# Patient Record
Sex: Male | Born: 1980
Health system: Southern US, Community
[De-identification: ages and names within clinical notes are randomized; demographics above are authoritative.]

## PROBLEM LIST (undated history)

## (undated) DIAGNOSIS — F419 Anxiety disorder, unspecified: Secondary | ICD-10-CM

## (undated) DIAGNOSIS — G8220 Paraplegia, unspecified: Secondary | ICD-10-CM

## (undated) DIAGNOSIS — F1910 Other psychoactive substance abuse, uncomplicated: Secondary | ICD-10-CM

## (undated) DIAGNOSIS — F329 Major depressive disorder, single episode, unspecified: Secondary | ICD-10-CM

## (undated) DIAGNOSIS — F32A Depression, unspecified: Secondary | ICD-10-CM

## (undated) DIAGNOSIS — W3400XA Accidental discharge from unspecified firearms or gun, initial encounter: Secondary | ICD-10-CM

## (undated) HISTORY — PX: OTHER SURGICAL HISTORY: SHX169

## (undated) HISTORY — PX: LEG SURGERY: SHX1003

---

## 2001-08-03 ENCOUNTER — Encounter: Payer: Self-pay | Admitting: Emergency Medicine

## 2001-08-03 ENCOUNTER — Emergency Department (HOSPITAL_COMMUNITY): Admission: EM | Admit: 2001-08-03 | Discharge: 2001-08-03 | Payer: Self-pay | Admitting: Emergency Medicine

## 2005-02-19 ENCOUNTER — Emergency Department (HOSPITAL_COMMUNITY): Admission: EM | Admit: 2005-02-19 | Discharge: 2005-02-19 | Payer: Self-pay | Admitting: Emergency Medicine

## 2010-10-07 ENCOUNTER — Emergency Department (HOSPITAL_COMMUNITY)
Admission: EM | Admit: 2010-10-07 | Discharge: 2010-10-07 | Payer: Self-pay | Source: Home / Self Care | Admitting: Emergency Medicine

## 2013-02-19 ENCOUNTER — Emergency Department (HOSPITAL_COMMUNITY)
Admission: EM | Admit: 2013-02-19 | Discharge: 2013-02-20 | Disposition: A | Discharge status: Other Acute Inpatient Hospital | Payer: Self-pay | Attending: Emergency Medicine | Admitting: Emergency Medicine

## 2013-02-19 ENCOUNTER — Encounter (HOSPITAL_COMMUNITY): Payer: Self-pay | Admitting: *Deleted

## 2013-02-19 DIAGNOSIS — F17200 Nicotine dependence, unspecified, uncomplicated: Secondary | ICD-10-CM | POA: Insufficient documentation

## 2013-02-19 DIAGNOSIS — F1920 Other psychoactive substance dependence, uncomplicated: Secondary | ICD-10-CM | POA: Diagnosis present

## 2013-02-19 DIAGNOSIS — F1010 Alcohol abuse, uncomplicated: Secondary | ICD-10-CM | POA: Insufficient documentation

## 2013-02-19 DIAGNOSIS — F1210 Cannabis abuse, uncomplicated: Secondary | ICD-10-CM | POA: Insufficient documentation

## 2013-02-19 DIAGNOSIS — F1410 Cocaine abuse, uncomplicated: Secondary | ICD-10-CM | POA: Insufficient documentation

## 2013-02-19 DIAGNOSIS — F1910 Other psychoactive substance abuse, uncomplicated: Secondary | ICD-10-CM | POA: Insufficient documentation

## 2013-02-19 HISTORY — DX: Depression, unspecified: F32.A

## 2013-02-19 HISTORY — DX: Major depressive disorder, single episode, unspecified: F32.9

## 2013-02-19 LAB — CBC WITH DIFFERENTIAL/PLATELET
Basophils Absolute: 0 10*3/uL (ref 0.0–0.1)
Basophils Relative: 0 % (ref 0–1)
Eosinophils Absolute: 0.1 10*3/uL (ref 0.0–0.7)
Eosinophils Relative: 1 % (ref 0–5)
HCT: 39.3 % (ref 39.0–52.0)
Hemoglobin: 13.9 g/dL (ref 13.0–17.0)
Lymphocytes Relative: 32 % (ref 12–46)
Lymphs Abs: 2.6 10*3/uL (ref 0.7–4.0)
MCH: 31.5 pg (ref 26.0–34.0)
MCHC: 35.4 g/dL (ref 30.0–36.0)
MCV: 89.1 fL (ref 78.0–100.0)
Monocytes Absolute: 0.7 10*3/uL (ref 0.1–1.0)
Monocytes Relative: 9 % (ref 3–12)
Neutro Abs: 4.7 10*3/uL (ref 1.7–7.7)
Neutrophils Relative %: 58 % (ref 43–77)
Platelets: 330 10*3/uL (ref 150–400)
RBC: 4.41 MIL/uL (ref 4.22–5.81)
RDW: 13.3 % (ref 11.5–15.5)
WBC: 8.1 10*3/uL (ref 4.0–10.5)

## 2013-02-19 LAB — BASIC METABOLIC PANEL
BUN: 13 mg/dL (ref 6–23)
CO2: 25 mEq/L (ref 19–32)
Calcium: 9.7 mg/dL (ref 8.4–10.5)
Chloride: 98 mEq/L (ref 96–112)
Creatinine, Ser: 1.01 mg/dL (ref 0.50–1.35)
GFR calc Af Amer: >90 mL/min (ref >90)
GFR calc non Af Amer: >90 mL/min (ref >90)
Glucose, Bld: 95 mg/dL (ref 70–99)
Potassium: 3.9 mEq/L (ref 3.5–5.1)
Sodium: 135 mEq/L (ref 135–145)

## 2013-02-19 LAB — RAPID URINE DRUG SCREEN, HOSP PERFORMED
Amphetamines: NOT DETECTED
Barbiturates: NOT DETECTED
Benzodiazepines: NOT DETECTED
Cocaine: POSITIVE — AB
Opiates: NOT DETECTED
Tetrahydrocannabinol: POSITIVE — AB

## 2013-02-19 LAB — ETHANOL: Alcohol, Ethyl (B): 71 mg/dL — ABNORMAL HIGH (ref 0–11)

## 2013-02-19 MED ORDER — LORAZEPAM 1 MG PO TABS: 0.0000 mg | ORAL_TABLET | Freq: Four times a day (QID) | ORAL | Status: DC

## 2013-02-19 MED ORDER — LORAZEPAM 1 MG PO TABS: 0.0000 mg | ORAL_TABLET | Freq: Two times a day (BID) | ORAL | Status: DC

## 2013-02-19 MED ORDER — VITAMIN B-1 100 MG PO TABS: 100.0000 mg | ORAL_TABLET | Freq: Every day | ORAL | Status: DC

## 2013-02-19 MED ORDER — ONDANSETRON HCL 4 MG PO TABS: 4.0000 mg | ORAL_TABLET | Freq: Three times a day (TID) | ORAL | Status: DC | PRN

## 2013-02-19 MED ORDER — IBUPROFEN 600 MG PO TABS: 600.0000 mg | ORAL_TABLET | Freq: Three times a day (TID) | ORAL | Status: DC | PRN

## 2013-02-19 MED ORDER — NICOTINE 21 MG/24HR TD PT24: 21.0000 mg | MEDICATED_PATCH | Freq: Every day | TRANSDERMAL | Status: DC

## 2013-02-19 MED ORDER — THIAMINE HCL 100 MG/ML IJ SOLN: 100.0000 mg | Freq: Every day | INTRAMUSCULAR | Status: DC

## 2013-02-19 MED ORDER — ALUM & MAG HYDROXIDE-SIMETH 200-200-20 MG/5ML PO SUSP: 30.0000 mL | ORAL | Status: DC | PRN

## 2013-02-19 MED ORDER — ZOLPIDEM TARTRATE 5 MG PO TABS: 5.0000 mg | ORAL_TABLET | Freq: Every evening | ORAL | Status: DC | PRN

## 2013-02-19 MED ORDER — ADULT MULTIVITAMIN W/MINERALS CH: 1.0000 | ORAL_TABLET | Freq: Every day | ORAL | Status: DC

## 2013-02-19 MED ORDER — FOLIC ACID 1 MG PO TABS: 1.0000 mg | ORAL_TABLET | Freq: Every day | ORAL | Status: DC

## 2013-02-19 NOTE — ED Notes (Signed)
Pt requesting detox from marijuana/cocaine/alcohol; last use today; calm and cooperative; denies si/hi

## 2013-02-20 ENCOUNTER — Inpatient Hospital Stay (HOSPITAL_COMMUNITY)
Admission: EM | Admit: 2013-02-20 | Discharge: 2013-02-23 | DRG: 897 | Disposition: A | Discharge status: Home / Self Care | Payer: No Typology Code available for payment source | Source: Intra-hospital | Attending: Psychiatry | Admitting: Psychiatry

## 2013-02-20 ENCOUNTER — Encounter (HOSPITAL_COMMUNITY): Payer: Self-pay | Admitting: *Deleted

## 2013-02-20 ENCOUNTER — Other Ambulatory Visit: Payer: Self-pay | Admitting: Medical

## 2013-02-20 DIAGNOSIS — F1020 Alcohol dependence, uncomplicated: Principal | ICD-10-CM | POA: Diagnosis present

## 2013-02-20 DIAGNOSIS — F39 Unspecified mood [affective] disorder: Secondary | ICD-10-CM

## 2013-02-20 DIAGNOSIS — F1920 Other psychoactive substance dependence, uncomplicated: Secondary | ICD-10-CM | POA: Diagnosis present

## 2013-02-20 DIAGNOSIS — F1420 Cocaine dependence, uncomplicated: Secondary | ICD-10-CM | POA: Diagnosis present

## 2013-02-20 DIAGNOSIS — Z79899 Other long term (current) drug therapy: Secondary | ICD-10-CM

## 2013-02-20 DIAGNOSIS — F1994 Other psychoactive substance use, unspecified with psychoactive substance-induced mood disorder: Secondary | ICD-10-CM | POA: Diagnosis present

## 2013-02-20 LAB — POCT I-STAT TROPONIN I: Troponin i, poc: 0 ng/mL (ref 0.00–0.08)

## 2013-02-20 LAB — HEPATIC FUNCTION PANEL
Bilirubin, Direct: <0.1 mg/dL (ref 0.0–0.3)
Total Protein: 7.3 g/dL (ref 6.0–8.3)

## 2013-02-20 MED ORDER — CHLORDIAZEPOXIDE HCL 25 MG PO CAPS
25.0000 mg | ORAL_CAPSULE | Freq: Four times a day (QID) | ORAL | Status: AC
  Administered 2013-02-20 (×4): 25 mg via ORAL
  Filled 2013-02-20 (×4): qty 1

## 2013-02-20 MED ORDER — VITAMIN B-1 100 MG PO TABS
100.0000 mg | ORAL_TABLET | Freq: Every day | ORAL | Status: DC
  Administered 2013-02-21 – 2013-02-23 (×3): 100 mg via ORAL
  Filled 2013-02-20 (×5): qty 1

## 2013-02-20 MED ORDER — NICOTINE POLACRILEX 2 MG MT GUM: 2.0000 mg | CHEWING_GUM | OROMUCOSAL | Status: DC | PRN

## 2013-02-20 MED ORDER — ACETAMINOPHEN 325 MG PO TABS: 650.0000 mg | ORAL_TABLET | Freq: Four times a day (QID) | ORAL | Status: DC | PRN

## 2013-02-20 MED ORDER — CHLORDIAZEPOXIDE HCL 25 MG PO CAPS
25.0000 mg | ORAL_CAPSULE | Freq: Four times a day (QID) | ORAL | Status: AC | PRN
  Administered 2013-02-20: 25 mg via ORAL
  Filled 2013-02-20: qty 1

## 2013-02-20 MED ORDER — MAGNESIUM HYDROXIDE 400 MG/5ML PO SUSP: 30.0000 mL | Freq: Every day | ORAL | Status: DC | PRN

## 2013-02-20 MED ORDER — CHLORDIAZEPOXIDE HCL 25 MG PO CAPS
25.0000 mg | ORAL_CAPSULE | ORAL | Status: AC
  Administered 2013-02-22 (×2): 25 mg via ORAL
  Filled 2013-02-20 (×2): qty 1

## 2013-02-20 MED ORDER — LOPERAMIDE HCL 2 MG PO CAPS: 2.0000 mg | ORAL_CAPSULE | ORAL | Status: AC | PRN

## 2013-02-20 MED ORDER — NICOTINE 21 MG/24HR TD PT24
21.0000 mg | MEDICATED_PATCH | Freq: Every day | TRANSDERMAL | Status: DC
  Administered 2013-02-20 – 2013-02-23 (×4): 21 mg via TRANSDERMAL
  Filled 2013-02-20 (×8): qty 1

## 2013-02-20 MED ORDER — LORAZEPAM 1 MG PO TABS
2.0000 mg | ORAL_TABLET | ORAL | Status: DC | PRN
  Administered 2013-02-20: 2 mg via ORAL
  Filled 2013-02-20: qty 2

## 2013-02-20 MED ORDER — THIAMINE HCL 100 MG/ML IJ SOLN
100.0000 mg | Freq: Once | INTRAMUSCULAR | Status: AC
  Administered 2013-02-20: 100 mg via INTRAMUSCULAR

## 2013-02-20 MED ORDER — TRAZODONE HCL 50 MG PO TABS
50.0000 mg | ORAL_TABLET | Freq: Every evening | ORAL | Status: DC | PRN
  Administered 2013-02-20 – 2013-02-22 (×3): 50 mg via ORAL
  Filled 2013-02-20 (×3): qty 1

## 2013-02-20 MED ORDER — ADULT MULTIVITAMIN W/MINERALS CH
1.0000 | ORAL_TABLET | Freq: Every day | ORAL | Status: DC
  Administered 2013-02-20 – 2013-02-23 (×4): 1 via ORAL
  Filled 2013-02-20 (×6): qty 1

## 2013-02-20 MED ORDER — CHLORDIAZEPOXIDE HCL 25 MG PO CAPS
25.0000 mg | ORAL_CAPSULE | Freq: Every day | ORAL | Status: AC
  Administered 2013-02-23: 25 mg via ORAL
  Filled 2013-02-20: qty 1

## 2013-02-20 MED ORDER — CHLORDIAZEPOXIDE HCL 25 MG PO CAPS
25.0000 mg | ORAL_CAPSULE | Freq: Three times a day (TID) | ORAL | Status: AC
  Administered 2013-02-21 (×3): 25 mg via ORAL
  Filled 2013-02-20 (×3): qty 1

## 2013-02-20 MED ORDER — ONDANSETRON 4 MG PO TBDP: 4.0000 mg | ORAL_TABLET | Freq: Four times a day (QID) | ORAL | Status: AC | PRN

## 2013-02-20 NOTE — BHH Counselor (Signed)
Pt has been accepted by Maryjean Morn, PA to the service of [facility], 300[facility]2.

## 2013-02-20 NOTE — Progress Notes (Signed)
D: Patient denies SI/HI and A/V hallucinations; patient reports sleep is well; reports appetite is good; reports energy level is normal ; reports ability to pay attention is good; rates depression as 1/10; rates hopelessness -0/10; rates anxiety as 6/10;   A: Monitored q 15 minutes; patient encouraged to attend groups; patient educated about medications; patient given medications per physician orders; patient encouraged to express feelings and/or concerns  R: Patient is flat and cooperative and pleasant; patient reports a relief of his withdrawal and reports that his anxiety is decreasing patient's interaction with staff and peers is appropriate;  patient is taking medications as prescribed and tolerating medications; patient is attending all groups and he is opening up in group and appears to be invested in treatment by engaging in conversation in the groups and sharing his story

## 2013-02-20 NOTE — ED Provider Notes (Signed)
Medical screening examination/treatment/procedure(s) were performed by non-physician practitioner and as supervising physician I was immediately available for consultation/collaboration.  Accepted to [facility] by Dr Dub Mikes  Sunnie Nielsen, MD 02/20/13 [phone]

## 2013-02-20 NOTE — BH Assessment (Signed)
Assessment Note   Brandon Glass is a 32 y.o. male who presents [facility] for detox from alcohol, thc and cocaine.  Pt denies SI/HI/Psych.  Pt reports the following: pt drinks 5-7 40's and 1/5 daily, last drink was 02/19/13, pt consumed 2 cans and 1-40oz beer; pt uses 1 gram of cocaine daily, last use was 02/19/13 and 1/2 up to quarter oz of thc daily, last use was 02/19/13.  Pt says--"I see myself becoming someone I'm not.".  Pt lives with girlfriend and son and states girlfriend encouraged him to seek help.  Pt has past inpt admissions with [facility] and [facility], both in 2012.  Pt denies seizure or blackout activity due to SA but says when he has withdrawals, he experiences cold sweats and tremors.  Pt reports legal issues--DUI(continuance), no court date at this time.  This Clinical research associate explained no detox protocol avail for thc and cocaine, encouraged pt to seek rehab after detox treatment to maintain sobriety.  No avail beds with [facility] and pt has no transportation for [facility].    Axis I: Alcohol Dependence, Cocaine Abuse, Cannabis Dependence Axis II: Deferred Axis III:  Past Medical History  Diagnosis Date  . Depression    Axis IV: other psychosocial or environmental problems, problems related to legal system/crime, problems related to social environment and problems with primary support group Axis V: 51-60 moderate symptoms  Past Medical History:  Past Medical History  Diagnosis Date  . Depression     History reviewed. No pertinent past surgical history.  Family History: No family history on file.  Social History:  reports that he has been smoking Cigarettes.  He has been smoking about 1.00 pack per day. He does not have any smokeless tobacco history on file. He reports that he drinks about 3.0 ounces of alcohol per week. He reports that he uses illicit drugs (Cocaine and Marijuana).  Additional Social History:  Alcohol / Drug Use Pain Medications: None  Prescriptions: None  Over the Counter: None   History of alcohol / drug use?: Yes Longest period of sobriety (when/how long): Only when in detox  Negative Consequences of Use: Personal relationships;Legal Withdrawal Symptoms: Other (Comment) (No current w/d sxs ) Substance #1 Name of Substance 1: Alcohol 1 - Age of First Use: Teens  1 - Amount (size/oz): 5-7 40's; 1/5  1 - Frequency: Daily  1 - Duration: On-going  1 - Last Use / Amount: 02/19/13 Substance #2 Name of Substance 2: THC  2 - Age of First Use: Teens  2 - Amount (size/oz): 1/2- quarter oz  2 - Frequency: Daily  2 - Duration: On-going  2 - Last Use / Amount: 02/19/13 Substance #3 Name of Substance 3: Cocaine  3 - Age of First Use: Teens  3 - Amount (size/oz): 1 Gram  3 - Frequency: Wkly  3 - Duration: On-going  3 - Last Use / Amount: 02/19/13  CIWA: CIWA-Ar BP: 138/64 mmHg Pulse Rate: 115 Nausea and Vomiting: no nausea and no vomiting Tactile Disturbances: none Tremor: no tremor Auditory Disturbances: not present Paroxysmal Sweats: no sweat visible Visual Disturbances: not present Anxiety: no anxiety, at ease Headache, Fullness in Head: none present Agitation: normal activity Orientation and Clouding of Sensorium: oriented and can do serial additions CIWA-Ar Total: 0 COWS:    Allergies: No Known Allergies  Home Medications:  (Not in a hospital admission)  OB/GYN Status:  No LMP for male patient.  General Assessment Data [facility]: [facility] Living Arrangements:  Spouse/significant other;Children (Lives w/girlfriend and son ) Can pt return to current living arrangement?: Yes Admission Status: Voluntary Is patient capable of signing voluntary admission?: Yes Transfer from: Acute Hospital Referral Source: MD  Education Status Is patient currently in school?: No Current Grade: None  Highest grade of school patient has completed: None  Name of school: None  Contact person: None   Risk to self Suicidal Ideation: No Suicidal Intent:  No Is patient at risk for suicide?: No Suicidal Plan?: No Access to Means: No What has been your use of drugs/alcohol within the last 12 months?: Abusing: alcohol, cocaine, thc Previous Attempts/Gestures: No How many times?: 0 Other Self Harm Risks: None  Triggers for Past Attempts: None known Intentional Self Injurious Behavior: None Family Suicide History: No Recent stressful life event(s): Other (Comment) (Chronic SA, relational due to SA) Persecutory voices/beliefs?: No Depression: Yes Depression Symptoms: Guilt;Loss of interest in usual pleasures;Feeling worthless/self pity Substance abuse history and/or treatment for substance abuse?: Yes Suicide prevention information given to non-admitted patients: Not applicable  Risk to Others Homicidal Ideation: No Thoughts of Harm to Others: No Current Homicidal Intent: No Current Homicidal Plan: No Access to Homicidal Means: No Identified Victim: None  History of harm to others?: No Assessment of Violence: None Noted Violent Behavior Description: None  Does patient have access to weapons?: No Criminal Charges Pending?: Yes Describe Pending Criminal Charges: DUI--Continuance x32yrs  Does patient have a court date: No  Psychosis Hallucinations: None noted Delusions: None noted  Mental Status Report Appear/Hygiene: Other (Comment) (Appropriate ) Eye Contact: Good Motor Activity: Unremarkable Speech: Logical/coherent Level of Consciousness: Alert Mood: Depressed Affect: Depressed Anxiety Level: None Thought Processes: Coherent;Relevant Judgement: Unimpaired Orientation: Person;Place;Time;Situation Obsessive Compulsive Thoughts/Behaviors: None  Cognitive Functioning Concentration: Normal Memory: Recent Intact;Remote Intact IQ: Average Insight: Fair Impulse Control: Fair Appetite: Good Weight Loss: 0 Weight Gain: 0 Sleep: No Change Total Hours of Sleep: 6 Vegetative Symptoms: None  ADLScreening [employer] Assessment  Services) Patient's cognitive ability adequate to safely complete daily activities?: Yes Patient able to express need for assistance with ADLs?: Yes Independently performs ADLs?: Yes (appropriate for developmental age)  Abuse/Neglect [facility]) Physical Abuse: Denies Verbal Abuse: Denies Sexual Abuse: Denies  Prior Inpatient Therapy Prior Inpatient Therapy: Yes Prior Therapy Dates: 2012 Prior Therapy Facilty/Provider(s): [facility]; [facility]  Reason for Treatment: Detox/Rehab   Prior Outpatient Therapy Prior Outpatient Therapy: No Prior Therapy Dates: None  Prior Therapy Facilty/Provider(s): None  Reason for Treatment: None   ADL Screening (condition at time of admission) Patient's cognitive ability adequate to safely complete daily activities?: Yes Patient able to express need for assistance with ADLs?: Yes Independently performs ADLs?: Yes (appropriate for developmental age) Weakness of Legs: None Weakness of Arms/Hands: None  Home Assistive Devices/Equipment Home Assistive Devices/Equipment: None  Therapy Consults (therapy consults require a physician order) PT Evaluation Needed: No OT Evalulation Needed: No SLP Evaluation Needed: No Abuse/Neglect Assessment (Assessment to be complete while patient is alone) Physical Abuse: Denies Verbal Abuse: Denies Sexual Abuse: Denies Exploitation of patient/patient's resources: Denies Self-Neglect: Denies Values / Beliefs Cultural Requests During Hospitalization: None Spiritual Requests During Hospitalization: None Consults Spiritual Care Consult Needed: No Social Work Consult Needed: No Merchant navy officer (For Healthcare) Advance Directive: Patient does not have advance directive;Patient would not like information Pre-existing out of facility DNR order (yellow form or pink MOST form): No Nutrition Screen- [facility] Patient's home diet: Regular Have you recently lost weight without trying?: No Have you been eating poorly because  of a  decreased appetite?: No Malnutrition Screening Tool Score: 0  Additional Information 1:1 In Past 12 Months?: No CIRT Risk: No Elopement Risk: No Does patient have medical clearance?: Yes     Disposition:  Disposition Initial Assessment Completed for this Encounter: Yes Disposition of Patient: Inpatient treatment program;Referred to [facility] ) Type of inpatient treatment program: Adult Patient referred to: Other (Comment) [facility] )  On Site Evaluation by:   Reviewed with Physician:     Murrell Redden 02/20/2013 1:47 AM

## 2013-02-20 NOTE — Progress Notes (Signed)
Adult Psychoeducational Group Note  Date:  02/20/2013 Time:  8:52 PM  Group Topic/Focus:  AA group  Participation Level:  Active  Participation Quality:  Appropriate  Affect:  Appropriate  Cognitive:  Alert  Insight: Appropriate  Engagement in Group:  Engaged  Modes of Intervention:  Discussion  Additional Comments:    Flonnie Hailstone 02/20/2013, 8:52 PM

## 2013-02-20 NOTE — BHH Group Notes (Signed)
[facility] LCSW Aftercare Discharge Planning Group Note   02/20/2013 8:45 AM  Participation Quality:  Appropriate  Mood/Affect:  Appropriate  Depression Rating:  3  Anxiety Rating:  6-7  Thoughts of Suicide:  No Will you contract for safety?   NA  Current AVH:  No  Plan for Discharge/Comments: Explore inpatient treatment options   Transportation Means: Family  Supports: Family, significant other, 31 YO son and church community  [place], Julious Payer
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
## 2013-02-20 NOTE — Progress Notes (Signed)
Pt vol admitted via [facility] for detox from alcohol. Pt drinking 5-7 40oz beers/daily and/or a fifth of liquor. Also using THC daily and occasional cocaine (1 gm per use). Pt wishes to get clean for girlfriend and child. Pending DUI in the courts. Last attempt at detox was x 2 in 2012. CIWA 2 at this time with elevated pulse and BP (see flowsheet). Medicated with librium prn. Oriented to unit and provided fluids (refused meal). Level III obs in place. On reassess pt is asleep. No medical hx, home meds or allergies. No SI/HI/AVH, no previous psych hx. Lawrence Marseilles

## 2013-02-20 NOTE — ED Notes (Signed)
Pt reports abusing THC and ETOH daily; drinking 5-7 forty ounces a day and sometimes a fifth of liquor. He says he doesn't use cocaine daily but when he does he uses about a gram. Pt has detoxed twice before the last time in 2012 and was clean for about 6 months after that then stress caused him to start using again. He's never been under the care of a psychiatrist and never attempted suicide. He denies SI/HI/AVH. He reports he wants to detox now to have a better life with his wife, son and family and everything.

## 2013-02-20 NOTE — BHH Group Notes (Signed)
B[facility] LCSW Group Therapy  02/20/2013 1:15 PM  Type of Therapy:  Group Therapy 1:15 to 2:30  Participation Level:  Minimal  Participation Quality:  Appropriate and Attentive  Affect:  Appropriate  Cognitive:  Alert, Appropriate and Oriented  Insight:  Developing/Improving  Engagement in Therapy:  Developing/Improving  Modes of Intervention:  Education, Exploration, Rapport Building, Socialization and Support  Summary of Progress/Problems: Group session included an educational portion on Post Acute Withdrawal Syndrome (PAWS).  Patients were able to process the information and share feelings related to length of time recovery takes. [name] was attentive to facilitator and those sharing their own process but did not share himself.    Brandon Glass

## 2013-02-20 NOTE — BHH Suicide Risk Assessment (Signed)
Suicide Risk Assessment  Admission Assessment     Nursing information obtained from:  Patient Demographic factors:  Male Current Mental Status:   (none) Loss Factors:  Legal issues Historical Factors:  Family history of mental illness or substance abuse Risk Reduction Factors:  Responsible for children under 31 years of age;Sense of responsibility to family;Living with another person, especially a relative;Positive social support;Positive therapeutic relationship  CLINICAL FACTORS:   Depression:   Comorbid alcohol abuse/dependence Alcohol/Substance Abuse/Dependencies  COGNITIVE FEATURES THAT CONTRIBUTE TO RISK: None identified   SUICIDE RISK:   Moderate:  Frequent suicidal ideation with limited intensity, and duration, some specificity in terms of plans, no associated intent, good self-control, limited dysphoria/symptomatology, some risk factors present, and identifiable protective factors, including available and accessible social support.  PLAN OF CARE: Supportive approach/coping skills/ relapse prevention                              Librium Detox protocol  I certify that inpatient services furnished can reasonably be expected to improve the patient's condition.  Ande Therrell A 02/20/2013, 9:09 AM

## 2013-02-20 NOTE — H&P (Signed)
Psychiatric Admission Assessment Adult  Patient Identification:  Brandon Glass Date of Evaluation:  02/20/2013 Chief Complaint:  detox History of Present Illness:: Drinking a lot, 5 to 7 40's and a fith of liquor every odther day. Rewal heavy since 2007-2008. Stress, life, lost friennds, one fried was murdere, another drawned. start drinking more and more. Last year go really bad last year (life stress, finances)  Endorses cocaine one gram every day, more during the weekend. The use escalated. Marijuana every day. Started drinkjng at 53. Cocaine started in [employer]. Elements:  Location:  in patient. Quality:  unable to function. Severity:  severe. Timing:  every day. Duration:  building up for several years. Context:  alcohol dependence,cocaine dependence, unresolved grief issue, poor copins. Associated Signs/Synptoms: Depression Symptoms:  depressed mood, anhedonia, anxiety, insomnia, disturbed sleep, (Hypo) Manic Symptoms:  denies Anxiety Symptoms:  Excessive Worry, Psychotic Symptoms:  Denies PTSD Symptoms: Denies   Psychiatric Specialty Exam: Physical Exam  ROS  Blood pressure 145/88, pulse 101, temperature 98.7 F (37.1 C), temperature source Oral, resp. rate 18, height 6\' 1"  (1.854 m), weight 102.967 kg (227 lb).Body mass index is 29.96 kg/(m^2).  General Appearance: Fairly Groomed  Patent attorney::  Minimal  Speech:  Clear and Coherent, Slow and not spontaneous  Volume:  Decreased  Mood:  Depressed  Affect:  Restricted  Thought Process:  Coherent and Goal Directed  Orientation:  Full (Time, Place, and Person)  Thought Content:  worries, concerns  Suicidal Thoughts:  No  Homicidal Thoughts:  No  Memory:  Immediate;   Fair Recent;   Fair Remote;   Fair  Judgement:  Fair  Insight:  Present  Psychomotor Activity:  Restlessness  Concentration:  Fair  Recall:  Fair  Akathisia:  No  Handed:  Right  AIMS (if indicated):     Assets:  Desire for Improvement Social  Support  Sleep:  Number of Hours: 0.75 (new admit)    Past Psychiatric History: Diagnosis: Acohol Dependence, Cocaine Dependence, Substance Induced Mood Disorder  Hospitalizations: Detox once before  Outpatient Care: Denies  Substance Abuse Care: [facility]( 2 years ago), was able to abstain for 6 months, quit seeing some people. He then got into seeing the same paople. Went to [facility] and stais 12 hours. Was snet for detox, did not go back afterwards.   Self-Mutilation: Denies  Suicidal Attempts: Denies  Violent Behaviors: Denies   Past Medical History:   Past Medical History  Diagnosis Date  . Depression     Allergies:  No Known Allergies PTA Medications: No prescriptions prior to admission    Previous Psychotropic Medications:  Medication/Dose  Denies               Substance Abuse History in the last 12 months:  yes  Consequences of Substance Abuse: Legal Consequences:  DWI Withdrawal Symptoms:   Diaphoresis Nausea Tremors Vomiting  Social History:  reports that he has been smoking Cigarettes.  He has been smoking about 1.00 pack per day. He does not have any smokeless tobacco history on file. He reports that he drinks about 3.0 ounces of alcohol per week. He reports that he uses illicit drugs (Cocaine and Marijuana). Additional Social History: Pain Medications: none Prescriptions: none Over the Counter: none History of alcohol / drug use?: Yes Negative Consequences of Use: Legal Withdrawal Symptoms: Tachycardia;Change in blood pressure;Other (Comment) (anxiety)                    Current Place  of Residence:  Staying with parents, lost apartment where he was staying with girlfriend and his son Place of Birth:   Family Members: Marital Status:  committed Children:  Sons: 3 Y/O  Daughters: Relationships: Education:  Designer, television/film set, music production Educational Problems/Performance: Religious Beliefs/Practices: Started going church History of Abuse  (Emotional/Phsycial/Sexual) Occupational Experiences; chasing a Education officer, museum, 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 Legal History: DWI (2 years ago) Hobbies/Interests:  Family History:  History reviewed. No pertinent family history.                             Alcohol and Drug dependence  Results for orders placed during the hospital encounter of 02/19/13 (from the past 72 hour(s))  URINE RAPID DRUG SCREEN (HOSP PERFORMED)     Status: Abnormal   Collection Time    02/19/13  9:37 PM      Result Value Range   Opiates NONE DETECTED  NONE DETECTED   Cocaine POSITIVE (*) NONE DETECTED   Benzodiazepines NONE DETECTED  NONE DETECTED   Amphetamines NONE DETECTED  NONE DETECTED   Tetrahydrocannabinol POSITIVE (*) NONE DETECTED   Barbiturates NONE DETECTED  NONE DETECTED   Comment:            DRUG SCREEN FOR MEDICAL PURPOSES     ONLY.  IF CONFIRMATION IS NEEDED     FOR ANY PURPOSE, NOTIFY LAB     WITHIN 5 DAYS.                LOWEST DETECTABLE LIMITS     FOR URINE DRUG SCREEN     Drug Class       Cutoff (ng/mL)     Amphetamine      1000     Barbiturate      200     Benzodiazepine   200     Tricyclics       300     Opiates          300     Cocaine          300     THC              50  CBC WITH DIFFERENTIAL     Status: None   Collection Time    02/19/13  9:49 PM      Result Value Range   WBC 8.1  4.0 - 10.5 K/uL   RBC 4.41  4.22 - 5.81 MIL/uL   Hemoglobin 13.9  13.0 - 17.0 g/dL   HCT 16.1  09.6 - 04.5 %   MCV 89.1  78.0 - 100.0 fL   MCH 31.5  26.0 - 34.0 pg   MCHC 35.4  30.0 - 36.0 g/dL   RDW 40.9  81.1 - 91.4 %   Platelets 330  150 - 400 K/uL   Neutrophils Relative 58  43 - 77 %   Neutro Abs 4.7  1.7 - 7.7 K/uL   Lymphocytes Relative 32  12 - 46 %   Lymphs Abs 2.6  0.7 - 4.0 K/uL   Monocytes Relative 9  3 - 12 %   Monocytes Absolute 0.7  0.1 - 1.0 K/uL   Eosinophils Relative 1  0 - 5 %   Eosinophils Absolute 0.1  0.0 - 0.7 K/uL   Basophils Relative 0  0 - 1 %    Basophils Absolute 0.0  0.0 - 0.1 K/uL  ETHANOL  Status: Abnormal   Collection Time    02/19/13  9:49 PM      Result Value Range   Alcohol, Ethyl (B) 71 (*) 0 - 11 mg/dL   Comment:            LOWEST DETECTABLE LIMIT FOR     SERUM ALCOHOL IS 11 mg/dL     FOR MEDICAL PURPOSES ONLY  BASIC METABOLIC PANEL     Status: None   Collection Time    02/19/13  9:49 PM      Result Value Range   Sodium 135  135 - 145 mEq/L   Potassium 3.9  3.5 - 5.1 mEq/L   Chloride 98  96 - 112 mEq/L   CO2 25  19 - 32 mEq/L   Glucose, Bld 95  70 - 99 mg/dL   BUN 13  6 - 23 mg/dL   Creatinine, Ser 1.61  0.50 - 1.35 mg/dL   Calcium 9.7  8.4 - 09.6 mg/dL   GFR calc non Af Amer >90  >90 mL/min   GFR calc Af Amer >90  >90 mL/min   Comment:            The eGFR has been calculated     using the CKD EPI equation.     This calculation has not been     validated in all clinical     situations.     eGFR's persistently     <90 mL/min signify     possible Chronic Kidney Disease.  POCT I-STAT TROPONIN I     Status: None   Collection Time    02/20/13 12:59 AM      Result Value Range   Troponin i, poc 0.00  0.00 - 0.08 ng/mL   Comment 3            Comment: Due to the release kinetics of cTnI,     a negative result within the first hours     of the onset of symptoms does not rule out     myocardial infarction with certainty.     If myocardial infarction is still suspected,     repeat the test at appropriate intervals.   Psychological Evaluations:  Assessment:   AXIS I:  Alcohol and cocaine dependence, substance induced mood disorder AXIS II:  Deferred AXIS III:   Past Medical History  Diagnosis Date  . Depression    AXIS IV:  economic problems, housing problems and occupational problems AXIS V:  41-50 serious symptoms  Treatment Plan/Recommendations:  Supportive approach/coping skills/relapse prevention                                                                 [facility] Detox Protocol                                                                  Reassess co morbdities Treatment Plan Summary: Daily contact with patient to assess and evaluate symptoms and progress in treatment Medication management Current Medications:  Current Facility-Administered Medications  Medication Dose Route Frequency Provider Last Rate Last Dose  . acetaminophen (TYLENOL) tablet 650 mg  650 mg Oral Q6H PRN Court Joy, PA-C      . chlordiazePOXIDE (LIBRIUM) capsule 25 mg  25 mg Oral Q6H PRN Court Joy, PA-C   25 mg at 02/20/13 0450  . loperamide (IMODIUM) capsule 2-4 mg  2-4 mg Oral PRN Court Joy, PA-C      . magnesium hydroxide (MILK OF MAGNESIA) suspension 30 mL  30 mL Oral Daily PRN Court Joy, PA-C      . nicotine polacrilex (NICORETTE) gum 2 mg  2 mg Oral PRN Rachael Fee, MD      . ondansetron (ZOFRAN-ODT) disintegrating tablet 4 mg  4 mg Oral Q6H PRN Court Joy, PA-C      . traZODone (DESYREL) tablet 50 mg  50 mg Oral QHS PRN,MR X 1 Court Joy, PA-C        Observation Level/Precautions:  Detox  Laboratory:  As per the ED  Psychotherapy:  Individual/group  Medications:  [facility] detox/reassess co morbidities  Consultations:    Discharge Concerns:    Estimated LOS: 3-5 days  Other:     I certify that inpatient services furnished can reasonably be expected to improve the patient's condition.   Shamar Engelmann A 4/25/20148:41 AM

## 2013-02-20 NOTE — Progress Notes (Signed)
Writer observed patient earlier sitting in the dayroom interacting with peers appropriately. Patient reports having had a good day and writer informed patient of medications available and scheduled. Patient currently denies si/hi/a/v hallucinations. Patient attended AA group this evening. Patient voiced no complaints and is agreeable to plan of care. Safety maintained with 15 min checks, will continue to monitor.

## 2013-02-20 NOTE — ED Notes (Signed)
ACT assessment in progress

## 2013-02-20 NOTE — ED Provider Notes (Signed)
History     CSN: [id number]  Arrival date & time 02/19/13  2101   First MD Initiated Contact with Patient 02/19/13 2259      Chief Complaint  Patient presents with  . Medical Clearance    (Consider location/radiation/quality/duration/timing/severity/associated sxs/prior treatment) The history is provided by the patient, a significant other and medical records. No language interpreter was used.   Brandon Glass is a 32 y.o. male  with a hx of drug and alcohol abuse presents to the Emergency Department complaining of gradual, persistent, progressively worsening alcohol abuse onset many months ago.  Pt states he drinks 5+ 40oz beers per day for many months now.  He also uses marajuana and occasional cocaine.  Pt states he snorted 1g of cocaine today.  Denies fever, chills, headache, chest pain, SOB, nausea, vomiting, diarrhea, weakness, dizziness, syncope, dysuria.  Pt with hx of [facility] and [facility] placement for drug and EtOH abuse in the past.  Pt denies SI/HI, auditory or visual hallucinations.      History reviewed. No pertinent past medical history.  History reviewed. No pertinent past surgical history.  No family history on file.  History  Substance Use Topics  . Smoking status: Current Every Day Smoker -- 1.00 packs/day    Types: Cigarettes  . Smokeless tobacco: Not on file  . Alcohol Use: 3.0 oz/week    5 Cans of beer per week      Review of Systems  Constitutional: Negative for fever, diaphoresis, appetite change, fatigue and unexpected weight change.  HENT: Negative for mouth sores and neck stiffness.   Eyes: Negative for visual disturbance.  Respiratory: Negative for cough, chest tightness, shortness of breath and wheezing.   Cardiovascular: Negative for chest pain.  Gastrointestinal: Negative for nausea, vomiting, abdominal pain, diarrhea and constipation.  Endocrine: Negative for polydipsia, polyphagia and polyuria.  Genitourinary: Negative for dysuria, urgency,  frequency and hematuria.  Musculoskeletal: Negative for back pain.  Skin: Negative for rash.  Allergic/Immunologic: Negative for immunocompromised state.  Neurological: Negative for syncope, light-headedness and headaches.  Hematological: Does not bruise/bleed easily.  Psychiatric/Behavioral: Negative for sleep disturbance. The patient is not nervous/anxious.     Allergies  Review of patient's allergies indicates no known allergies.  Home Medications  No current outpatient prescriptions on file.  BP 138/64  Pulse 115  Resp 20  SpO2 99%  Physical Exam  Nursing note and vitals reviewed. Constitutional: He is oriented to person, place, and time. He appears well-developed and well-nourished. No distress.  HENT:  Head: Normocephalic and atraumatic.  Mouth/Throat: Oropharynx is clear and moist. No oropharyngeal exudate.  Eyes: Conjunctivae are normal. Pupils are equal, round, and reactive to light. No scleral icterus.  Neck: Normal range of motion. Neck supple.  Cardiovascular: Regular rhythm, normal heart sounds and intact distal pulses.  Tachycardia present.   Pulses:      Radial pulses are 2+ on the right side, and 2+ on the left side.       Dorsalis pedis pulses are 2+ on the right side, and 2+ on the left side.       Posterior tibial pulses are 2+ on the right side, and 2+ on the left side.  Pulmonary/Chest: Effort normal and breath sounds normal. No respiratory distress. He has no wheezes. He has no rales.  Abdominal: Soft. Bowel sounds are normal. He exhibits no distension and no mass. There is no tenderness. There is no rebound and no guarding.  Musculoskeletal: Normal range of motion. He  exhibits no edema and no tenderness.  Lymphadenopathy:    He has no cervical adenopathy.  Neurological: He is alert and oriented to person, place, and time. He exhibits normal muscle tone. Coordination normal.  Speech is clear and goal oriented Moves extremities without ataxia  Skin: Skin  is warm and dry. He is not diaphoretic. No erythema.  Psychiatric: He has a normal mood and affect. His speech is normal and behavior is normal. Judgment and thought content normal. Cognition and memory are normal. He expresses no homicidal and no suicidal ideation. He expresses no suicidal plans and no homicidal plans.    ED Course  Procedures (including critical care time)  Labs Reviewed  ETHANOL - Abnormal; Notable for the following:    Alcohol, Ethyl (B) 71 (*)    All other components within normal limits  URINE RAPID DRUG SCREEN (HOSP PERFORMED) - Abnormal; Notable for the following:    Cocaine POSITIVE (*)    Tetrahydrocannabinol POSITIVE (*)    All other components within normal limits  CBC WITH DIFFERENTIAL  BASIC METABOLIC PANEL   No results found. ECG:  Date: 02/20/2013  Rate: 92  Rhythm: normal sinus rhythm  QRS Axis: normal  Intervals: normal  ST/T Wave abnormalities: nonspecific ST changes  Conduction Disutrbances:none  Narrative Interpretation: lateral ST elevation possibly 2/2 early repol  Old EKG Reviewed: none available    1. ETOH abuse   2. Polysubstance abuse   3. Cocaine abuse       MDM  Brandon Glass presents with requests for drug and alcohol detox.  Pt with mild lateral ST elevation on ECG possibly 2/2 early repol.  Troponin negative.  Patient has been medically cleared in the ED and is awaiting consult by ACT team for possible placement vs OP clinic information. Pt is currently not having SI or HI and appears stable in NAD. Pt is cleared to be moved back to [facility].         Dahlia Client Harvir Patry, PA-C 02/20/13 [phone]

## 2013-02-20 NOTE — Tx Team (Signed)
Interdisciplinary Treatment Plan Update (Adult)  Date: 02/20/2013  Time Reviewed: 10:11 AM   Progress in Treatment: Attending groups: Yes Participating in groups: Yes Taking medication as prescribed:  Yes Tolerating medication:  Yes Family/Significant othe contact made:Not as yet Patient understands diagnosis: Yes Discussing patient identified problems/goals with staff: Yes Medical problems stabilized or resolved:  Yes Denies suicidal/homicidal ideation:Yes Patient has not harmed self or Others: Yes  New problem(s) identified: None Identified  Discharge Plan or Barriers:  CSW is assessing for appropriate referrals.   Additional comments: N/A  Reason for Continuation of Hospitalization: Depression Substance Induced Mood Disorder Medication stabilization Withdrawal symptom  Estimated length of stay: 3-5 days  For review of initial/current patient goals, please see plan of care.  Attendees: Patient:     Family:     Physician:  Geoffery Lyons 02/20/2013 10:11 AM   Nursing:   Leighton Parody, RN 02/20/2013 10:11 AM   Clinical Social Worker Ronda Fairly 02/20/2013 10:11 AM   Other:  Brigid Re, Pharmd [facility] 02/20/2013 10:11 AM   Other:  Dub Amis, Haroldine Laws Nursing Student 02/20/2013 10:11 AM   Other:  Maylon Peppers Nursing Student 02/20/2013 10:11 AM   Other: Liliane Bade, P4 02/20/2013 10:11 AM  Other: Harold Barban, RN 02/20/2013 10:11 AM  Other:  Serena Colonel, PA 02/20/2013 10:11 AM    Scribe for Treatment Team:   Carney Bern, LCSWA  02/20/2013 10:11 AM

## 2013-02-20 NOTE — Tx Team (Signed)
Initial Interdisciplinary Treatment Plan  PATIENT STRENGTHS: (choose at least two) Ability for insight Average or above average intelligence General fund of knowledge Motivation for treatment/growth Supportive family/friends  PATIENT STRESSORS: Legal issue Substance abuse   PROBLEM LIST: Problem List/Patient Goals Date to be addressed Date deferred Reason deferred Estimated date of resolution  Polysubstance 02/20/13                                                      DISCHARGE CRITERIA:  Improved stabilization in mood, thinking, and/or behavior Verbal commitment to aftercare and medication compliance Withdrawal symptoms are absent or subacute and managed without 24-hour nursing intervention  PRELIMINARY DISCHARGE PLAN: Attend 12-step recovery group Return to previous living arrangement  PATIENT/FAMIILY INVOLVEMENT: This treatment plan has been presented to and reviewed with the patient, Brandon Glass, and/or family member.  The patient and family have been given the opportunity to ask questions and make suggestions.  [name] [facility] 02/20/2013, 5:27 AM

## 2013-02-21 DIAGNOSIS — F1020 Alcohol dependence, uncomplicated: Principal | ICD-10-CM

## 2013-02-21 DIAGNOSIS — F1994 Other psychoactive substance use, unspecified with psychoactive substance-induced mood disorder: Secondary | ICD-10-CM

## 2013-02-21 DIAGNOSIS — F1420 Cocaine dependence, uncomplicated: Secondary | ICD-10-CM

## 2013-02-21 NOTE — BHH Group Notes (Signed)
[facility] LCSW Group Therapy  02/21/2013 10:00-11:00AM  Summary of Progress/Problems:  The main focus of today's process group was for the patients to identify ways in which they have in the past sabotaged their own recovery. Motivational Interviewing was utilized to ask the group members what they get out of their substance use, and what they want to change.  Scaling question was used to determine level of motivation (1 for lowest, 10 for highest) and discrepancies were pointed out, discussed.  The patient expressed that he tends to sabotage himself by isolating due to his depression, which leads to more drinking.  He stated out loud, after listening to another patient, "I am an alcoholic" while shaking his head and staring at the floor, saying that this other person's story was so much like his, it made him realize he has a problem.  His motivation to change is 10, because he is "fed up", having started heavy drinking at age 9.  He wants to go to a 30-day treatment program and go to [employer]  Type of Therapy:  Group Therapy  Participation Level:  Active  Participation Quality:  Appropriate, Attentive, Sharing and Supportive  Affect:  Depressed  Cognitive:  Appropriate and Oriented  Insight:  Engaged  Engagement in Therapy:  Engaged  Modes of Intervention:  Discussion, Exploration and Motivational Interviewing  Sarina Ser 02/21/2013, 12:51 PM

## 2013-02-21 NOTE — Progress Notes (Addendum)
Patient ID: Brandon Glass, male   DOB: 1981-08-03, 32 y.o.   MRN: [id number] D: Pt is awake and active on the unit this AM. Pt denies SI/HI and A/V hallucinations. Pt rates their depression at 1. Pt's most recent CIWA score was 3. Pt mood is depressed and affect is appropriate/flat. Pt writes that he wants to stop drinking ETOH and go back to school. Pt forwards little to staff but is participating in the milieu. Pt acknowledges that he needs to stay clean and sober after discharge. Writer encouraged pt to get a sponsor and join AA.  A: Encouraged pt to discuss feelings with staff and administered medication per MD orders. Writer also encouraged pt to participate in groups.  R: Pt is attending groups and tolerating medications well. Writer will continue to monitor. 15 minute checks are ongoing for safety.

## 2013-02-21 NOTE — Progress Notes (Signed)
Adult Psychoeducational Group Note  Date:  02/21/2013 Time:  0900  Group Topic/Focus:  Healthy Communication:   The focus of this group is to discuss communication, barriers to communication, as well as healthy ways to communicate with others.  Participation Level:  Minimal  Participation Quality:  Appropriate and Attentive  Affect:  Flat  Cognitive:  Alert and Appropriate  Insight: Appropriate  Engagement in Group:  Limited  Modes of Intervention:  Discussion and Education  Additional Comments:  Pt listened attentively but did not share.   Gregroy Dombkowski Shari Prows 02/21/2013, 9:32 AM

## 2013-02-21 NOTE — Progress Notes (Signed)
Psychoeducational Group Note  Date:  02/21/2013 Time:  0945 am  Group Topic/Focus:  Identifying Needs:   The focus of this group is to help patients identify their personal needs that have been historically problematic and identify healthy behaviors to address their needs.  Participation Level:  Active  Participation Quality:  Appropriate  Affect:  Appropriate  Cognitive:  Alert  Insight:  Developing/Improving  Engagement in Group:  Supportive  Additional Comments:    Andrena Mews 02/21/2013,3:15 PM

## 2013-02-21 NOTE — Progress Notes (Signed)
[facility] MD Progress Note  02/21/2013 10:16 AM CASIMER RUSSETT  MRN:  [id number] Subjective:  Jahden reports he is doing much better today. He had a good night sleep last night, and is eating well. He continues to endorse some tremors, as well as cravings for alcohol. He denies any suicidal or homicidal ideation. He denies any auditory or visual hallucinations. He hopes to go to a residential treatment facility after discharge from here.  Diagnosis:   Axis I: Alcohol and cocaine dependence, substance induced mood disorder Axis II: Deferred Axis III:  Past Medical History  Diagnosis Date  . Depression     ADL's:  Intact  Sleep: Good  Appetite:  Good  Suicidal Ideation:  Patient denies any thought, plan, or intent Homicidal Ideation:  Patient denies any thought, plan, or intent AEB (as evidenced by):  Psychiatric Specialty Exam: Review of Systems  Constitutional: Negative.   HENT: Negative.   Eyes: Negative.   Respiratory: Negative.   Cardiovascular: Negative.   Gastrointestinal: Negative.   Genitourinary: Negative.   Musculoskeletal: Negative.   Neurological: Negative.   Endo/Heme/Allergies: Negative.   Psychiatric/Behavioral: Negative.     Blood pressure 126/82, pulse 93, temperature 98.1 F (36.7 C), temperature source Oral, resp. rate 16, height 6\' 1"  (1.854 m), weight 102.967 kg (227 lb).Body mass index is 29.96 kg/(m^2).  General Appearance: Casual  Eye Contact::  Good  Speech:  Clear and Coherent  Volume:  Normal  Mood:  Anxious  Affect:  Congruent  Thought Process:  Linear  Orientation:  Full (Time, Place, and Person)  Thought Content:  WDL  Suicidal Thoughts:  No  Homicidal Thoughts:  No  Memory:  Immediate;   Good Recent;   Good Remote;   Good  Judgement:  Fair  Insight:  Fair  Psychomotor Activity:  Normal  Concentration:  Good  Recall:  Good  Akathisia:  No  Handed:  Right  AIMS (if indicated):     Assets:  Communication Skills Desire for  Improvement Physical Health  Sleep:  Number of Hours: 6.25   Current Medications: Current Facility-Administered Medications  Medication Dose Route Frequency Provider Last Rate Last Dose  . acetaminophen (TYLENOL) tablet 650 mg  650 mg Oral Q6H PRN Court Joy, PA-C      . chlordiazePOXIDE (LIBRIUM) capsule 25 mg  25 mg Oral Q6H PRN Court Joy, PA-C   25 mg at 02/20/13 0450  . chlordiazePOXIDE (LIBRIUM) capsule 25 mg  25 mg Oral TID Rachael Fee, MD   25 mg at 02/21/13 0830   Followed by  . [START ON 02/22/2013] chlordiazePOXIDE (LIBRIUM) capsule 25 mg  25 mg Oral BH-qamhs Rachael Fee, MD       Followed by  . [START ON 02/23/2013] chlordiazePOXIDE (LIBRIUM) capsule 25 mg  25 mg Oral Daily Rachael Fee, MD      . loperamide (IMODIUM) capsule 2-4 mg  2-4 mg Oral PRN Court Joy, PA-C      . magnesium hydroxide (MILK OF MAGNESIA) suspension 30 mL  30 mL Oral Daily PRN Court Joy, PA-C      . multivitamin with minerals tablet 1 tablet  1 tablet Oral Daily Rachael Fee, MD   1 tablet at 02/21/13 0831  . nicotine (NICODERM CQ - dosed in mg/24 hours) patch 21 mg  21 mg Transdermal Daily Rachael Fee, MD   21 mg at 02/21/13 0800  . ondansetron (ZOFRAN-ODT) disintegrating tablet 4 mg  4 mg  Oral Q6H PRN Court Joy, PA-C      . thiamine (VITAMIN B-1) tablet 100 mg  100 mg Oral Daily Rachael Fee, MD   100 mg at 02/21/13 0830  . traZODone (DESYREL) tablet 50 mg  50 mg Oral QHS PRN,MR X 1 Court Joy, PA-C   50 mg at 02/20/13 2211    Lab Results:  Results for orders placed during the hospital encounter of 02/20/13 (from the past 48 hour(s))  HEPATIC FUNCTION PANEL     Status: Abnormal   Collection Time    02/20/13  7:20 PM      Result Value Range   Total Protein 7.3  6.0 - 8.3 g/dL   Albumin 4.0  3.5 - 5.2 g/dL   AST 21  0 - 37 U/L   ALT 16  0 - 53 U/L   Alkaline Phosphatase 57  39 - 117 U/L   Total Bilirubin 0.2 (*) 0.3 - 1.2 mg/dL   Bilirubin, Direct <1.6  0.0 -  0.3 mg/dL   Indirect Bilirubin NOT CALCULATED  0.3 - 0.9 mg/dL    Physical Findings: AIMS: Facial and Oral Movements Muscles of Facial Expression: None, normal Lips and Perioral Area: None, normal Jaw: None, normal Tongue: None, normal,Extremity Movements Upper (arms, wrists, hands, fingers): None, normal Lower (legs, knees, ankles, toes): None, normal, Trunk Movements Neck, shoulders, hips: None, normal, Overall Severity Severity of abnormal movements (highest score from questions above): None, normal Incapacitation due to abnormal movements: None, normal Patient's awareness of abnormal movements (rate only patient's report): No Awareness, Dental Status Current problems with teeth and/or dentures?: No Does patient usually wear dentures?: No  CIWA:  CIWA-Ar Total: 0 COWS:     Treatment Plan Summary: Daily contact with patient to assess and evaluate symptoms and progress in treatment Medication management Refer for ongoing treatment of substance abuse  Plan: Continue librium per CIWA protocol.  Medical Decision Making Problem Points:  Established problem, stable/improving (1), Review of last therapy session (1) and Review of psycho-social stressors (1) Data Points:  Review or order clinical lab tests (1) Review and summation of old records (2) Review of medication regiment & side effects (2)  I certify that inpatient services furnished can reasonably be expected to improve the patient's condition.   WATT,ALAN 02/21/2013, 10:16 AM  Discussed with provider. Reviewed note. Agree with note, assessment and plan.  Jacqulyn Cane, M.D.  02/21/2013 6:36 PM

## 2013-02-21 NOTE — Progress Notes (Signed)
Writer spoke with patient 1:1 and he reports having had a good day. Patient inquired as to possibly when he might be discharged and writer encouraged him to speak with the doctor or NP on tomorrow. Patient voiced no complaints and reports that his detox is going fine and he is experiencing no withdrawals. Patient denies si/hi/a/v hallucinations. Patient plans to work on enrolling  in school after discharge. Writer offered support and encouragement, safety maintained on unit with 15 min checks, will continue to monitor.

## 2013-02-22 NOTE — [facility] Group Notes (Signed)
[facility] Group Notes:  (Clinical Social Work)  02/22/2013  10:00-11:00AM  Summary of Progress/Problems:   The main focus of today's process group was to   identify the patient's current support system and decide on other supports that can be put in place.  Four definitions/levels of support were discussed and an exercise was utilized to show how much stronger we become with additional supports.  An emphasis was placed on using counselor, doctor, therapy groups, 12-step groups, and problem-specific support groups to expand supports, as well as doing something different than has been done before. The patient expressed a willingness to add Alcoholic Anonymous once he gets out of a 30-day rehab program.  He plans to attend 90 meetings in 90 days, and get a sponsor which he has never done before.  He is exhausted with this cycle and wants to use his father's support and tough love to start a new direction.  Type of Therapy:  Process Group with Motivational Interviewing  Participation Level:  Active  Participation Quality:  Appropriate, Attentive, Sharing and Supportive  Affect:  Appropriate  Cognitive:  Appropriate and Oriented  Insight:  Engaged  Engagement in Therapy:  Engaged  Modes of Intervention:  Clarification, Education, Support and Processing, Activity  [employer], LCSW 02/22/2013, 12:02 PM

## 2013-02-22 NOTE — Progress Notes (Signed)
Adult Psychoeducational Group Note  Date:  02/22/2013 Time:  0830  Group Topic/Focus:  Self Inventory sheet  Participation Level:  Active  Participation Quality:  Appropriate and Attentive  Affect:  Appropriate  Cognitive:  Appropriate  Insight: Appropriate  Engagement in Group:  Engaged  Modes of Intervention:  Discussion  Additional Comments:  participated  Roselee Culver 02/22/2013, 9:05 AM

## 2013-02-22 NOTE — Progress Notes (Signed)
Writer has observed patient up in the dayroom interacting appropriately with peers. Patient has also bee in the activity room playing the piano for peers as they sang to his music. Patient reports that his detox is going well and is hopeful to discharge on Monday. Patient reports that he plans to [facility] and start attending AA groups after discharge. Patient reports that he plans to change his circle of friends and stop drinking and getting his because he is getting older and needs to start settling down. Support and encouragement offered, safety maintained on unit with 15 min checks, will continue to monitor. Patient denies si/hi/a/v hallucinations.

## 2013-02-22 NOTE — Progress Notes (Signed)
Psychoeducational Group Note  Date:  02/22/2013 Time:  0945 am  Group Topic/Focus:  Making Healthy Choices:   The focus of this group is to help patients identify negative/unhealthy choices they were using prior to admission and identify positive/healthier coping strategies to replace them upon discharge.  Participation Level:  Active  Participation Quality:  Sharing  Affect:  Appropriate  Cognitive:  Alert  Insight:  Engaged  Engagement in Group:  Engaged  Additional Comments:    Andrena Mews 02/22/2013, 10:29 AM

## 2013-02-22 NOTE — Progress Notes (Signed)
Adult Psychoeducational Group Note  Date:  02/22/2013 Time:  3:57 PM  Group Topic/Focus:  Conflict Resolution:   The focus of this group is to discuss the conflict resolution process and how it may be used upon discharge.  Participation Level:  Active  Participation Quality:  Appropriate, Sharing and Supportive  Affect:  Appropriate  Cognitive:  Appropriate  Insight: Appropriate  Engagement in Group:  Engaged and Supportive  Modes of Intervention:  Discussion, Education and Support  Additional Comments:  Pt was apropiate and involved.  Isla Pence M 02/22/2013, 3:57 PM

## 2013-02-22 NOTE — Progress Notes (Signed)
D slept well last nite Trazadone helped him a lot last nite for sleep, appetite is good going to DR for meals, energy level is normal for him and his ability to pay attention is good, depressed 1/10 and hopeless 1/10 today, no WD s/s noted today, denies Si or HI, attending group A q64min safety checks continue and support offered, patient attending group and participating in activities R patient is safe on the unit

## 2013-02-22 NOTE — Progress Notes (Addendum)
Patient ID: Brandon Glass, male   DOB: 1981-04-11, 32 y.o.   MRN: [id number] [facility] MD Progress Note  02/22/2013 11:20 AM Brandon Glass  MRN:  [id number]  Subjective: Brandon Glass reports some mild tremors to hand areas. Slept well because Trazodone is helping and would like to be discharged on it. Hopes to be discharged in am as he feels that he has improved quite much. Would consider going to a treatment facility after discharge. Denies any suicidal, homicidal ideations, auditory and or visual hallucinations.   Diagnosis:   Axis I: Alcohol and cocaine dependence, substance induced mood disorder Axis II: Deferred Axis III:  Past Medical History  Diagnosis Date  . Depression     ADL's:  Intact  Sleep: Good  Appetite:  Good  Suicidal Ideation:  Patient denies any thought, plan, or intent Homicidal Ideation:  Patient denies any thought, plan, or intent  AEB (as evidenced by): per patient's reports  Psychiatric Specialty Exam: Review of Systems  Constitutional: Negative.   HENT: Negative.   Eyes: Negative.   Respiratory: Negative.   Cardiovascular: Negative.   Gastrointestinal: Negative.   Genitourinary: Negative.   Musculoskeletal: Negative.   Neurological: Negative.   Endo/Heme/Allergies: Negative.   Psychiatric/Behavioral: Negative.     Blood pressure 134/81, pulse 85, temperature 97.5 F (36.4 C), temperature source Oral, resp. rate 18, height 6\' 1"  (1.854 m), weight 102.967 kg (227 lb).Body mass index is 29.96 kg/(m^2).  General Appearance: Casual  Eye Contact::  Good  Speech:  Clear and Coherent  Volume:  Normal  Mood:  Anxious  Affect:  Congruent  Thought Process:  Linear  Orientation:  Full (Time, Place, and Person)  Thought Content:  WDL  Suicidal Thoughts:  No  Homicidal Thoughts:  No  Memory:  Immediate;   Good Recent;   Good Remote;   Good  Judgement:  Fair  Insight:  Fair  Psychomotor Activity:  Normal  Concentration:  Good  Recall:  Good   Akathisia:  No  Handed:  Right  AIMS (if indicated):     Assets:  Communication Skills Desire for Improvement Physical Health  Sleep:  Number of Hours: 6   Current Medications: Current Facility-Administered Medications  Medication Dose Route Frequency Provider Last Rate Last Dose  . acetaminophen (TYLENOL) tablet 650 mg  650 mg Oral Q6H PRN Court Joy, PA-C      . chlordiazePOXIDE (LIBRIUM) capsule 25 mg  25 mg Oral Q6H PRN Court Joy, PA-C   25 mg at 02/20/13 0450  . chlordiazePOXIDE (LIBRIUM) capsule 25 mg  25 mg Oral BH-qamhs Rachael Fee, MD   25 mg at 02/22/13 0102   Followed by  . [START ON 02/23/2013] chlordiazePOXIDE (LIBRIUM) capsule 25 mg  25 mg Oral Daily Rachael Fee, MD      . loperamide (IMODIUM) capsule 2-4 mg  2-4 mg Oral PRN Court Joy, PA-C      . magnesium hydroxide (MILK OF MAGNESIA) suspension 30 mL  30 mL Oral Daily PRN Court Joy, PA-C      . multivitamin with minerals tablet 1 tablet  1 tablet Oral Daily Rachael Fee, MD   1 tablet at 02/22/13 [phone]  . nicotine (NICODERM CQ - dosed in mg/24 hours) patch 21 mg  21 mg Transdermal Daily Rachael Fee, MD   21 mg at 02/22/13 0752  . ondansetron (ZOFRAN-ODT) disintegrating tablet 4 mg  4 mg Oral Q6H PRN Court Joy, PA-C      .  thiamine (VITAMIN B-1) tablet 100 mg  100 mg Oral Daily Rachael Fee, MD   100 mg at 02/22/13 0752  . traZODone (DESYREL) tablet 50 mg  50 mg Oral QHS PRN,MR X 1 Court Joy, PA-C   50 mg at 02/21/13 2212    Lab Results:  Results for orders placed during the hospital encounter of 02/20/13 (from the past 48 hour(s))  HEPATIC FUNCTION PANEL     Status: Abnormal   Collection Time    02/20/13  7:20 PM      Result Value Range   Total Protein 7.3  6.0 - 8.3 g/dL   Albumin 4.0  3.5 - 5.2 g/dL   AST 21  0 - 37 U/L   ALT 16  0 - 53 U/L   Alkaline Phosphatase 57  39 - 117 U/L   Total Bilirubin 0.2 (*) 0.3 - 1.2 mg/dL   Bilirubin, Direct <1.6  0.0 - 0.3 mg/dL    Indirect Bilirubin NOT CALCULATED  0.3 - 0.9 mg/dL    Physical Findings: AIMS: Facial and Oral Movements Muscles of Facial Expression: None, normal Lips and Perioral Area: None, normal Jaw: None, normal Tongue: None, normal,Extremity Movements Upper (arms, wrists, hands, fingers): None, normal Lower (legs, knees, ankles, toes): None, normal, Trunk Movements Neck, shoulders, hips: None, normal, Overall Severity Severity of abnormal movements (highest score from questions above): None, normal Incapacitation due to abnormal movements: None, normal Patient's awareness of abnormal movements (rate only patient's report): No Awareness, Dental Status Current problems with teeth and/or dentures?: No Does patient usually wear dentures?: No  CIWA:  CIWA-Ar Total: 3 COWS:     Treatment Plan Summary: Daily contact with patient to assess and evaluate symptoms and progress in treatment Medication management Refer for ongoing treatment of substance abuse  Plan: Supportive approach/coping skills/relapse prevention. Possible discharge in am. Encouraged out of room, participation in group sessions and application of coping skills when distressed. Will continue to monitor response to/adverse effects of medications in use to assure effectiveness. Continue to monitor mood, behavior and interaction with staff and other patients. Continue current plan of care.  Medical Decision Making Problem Points:  Established problem, stable/improving (1), Review of last therapy session (1) and Review of psycho-social stressors (1) Data Points:  Review or order clinical lab tests (1) Review and summation of old records (2) Review of medication regiment & side effects (2)  I certify that inpatient services furnished can reasonably be expected to improve the patient's condition.   Armandina Stammer I 02/22/2013, 11:20 AM  Discussed with provider. Reviewed note. Agree with note, assessment and plan.  Jacqulyn Cane,  M.D.  02/22/2013 8:26 PM

## 2013-02-23 MED ORDER — TRAZODONE HCL 50 MG PO TABS
50.0000 mg | ORAL_TABLET | Freq: Every day | ORAL | Status: DC
  Filled 2013-02-23: qty 14

## 2013-02-23 MED ORDER — TRAZODONE HCL 50 MG PO TABS: ORAL_TABLET | ORAL | Status: DC

## 2013-02-23 NOTE — Discharge Summary (Signed)
Physician Discharge Summary Note  Patient:  Brandon Glass is an 32 y.o., male MRN:  [id number] DOB:  April 12, 1981 Patient phone:  [phone] (home)  Patient address:   [address]  [place] [facility] [address],   Date of Admission:  02/20/2013  Date of Discharge: 02/23/13  Reason for Admission:  Alcohol intoxication  Discharge Diagnoses: Active Problems:   Alcohol dependence   Cocaine dependence   Substance induced mood disorder  Review of Systems  Constitutional: Negative.   HENT: Negative.   Eyes: Negative.   Respiratory: Negative.   Cardiovascular: Negative.   Gastrointestinal: Negative.   Genitourinary: Negative.   Musculoskeletal: Negative.   Skin: Negative.  Negative for itching and rash.       Numerous tattoos to skin areas.   Neurological: Negative.   Psychiatric/Behavioral: Positive for substance abuse (Alcohol abuse). Negative for depression, suicidal ideas, hallucinations and memory loss. The patient has insomnia (Stabilized with medication prior to discharge). The patient is not nervous/anxious.    Axis Diagnosis:   AXIS I:  Substance Induced Mood Disorder and Alcohol dependence, Cocaine dependence, Polysubstance dependence, non-opioid, continuous AXIS II:  Deferred AXIS III:   Past Medical History  Diagnosis Date  . Depression    AXIS IV:  other psychosocial or environmental problems and Substance abuse issues AXIS V:  64  Level of Care:  OP  Hospital Course:  Drinking a lot, 5 to 7 40's and a fith of liquor every odther day. Rewal heavy since 2007-2008. Stress, life, lost friennds, one fried was murdere, another drawned. start drinking more and more. Last year go really bad last year (life stress, finances) Endorses cocaine one gram every day, more during the weekend. The use escalated. Marijuana every day. Started drinkjng at 6. Cocaine started in [employer].   Upon admission into this hospital, and after admission assessment/evaluation, it  was determined that patient will need detoxification treatment to stabilize his system of alcohol/drug intoxication and to combat the withdrawal symptoms of these substances as well. And his discharge plans included a referral to a long term treatment facility for more intense substance abuse treatment. Brandon Glass was then started on Librium protocol for his alcohol detoxification. He was also enrolled in group counseling sessions and activities to learn coping skills that should help him after discharge to cope better, manage his substance abuse problems to maintain a much longer sobriety. He also was enrolled and attended AA/NA meetings being offered and held on this unit. He has no previously existing and or identifiable medical conditions that required treatment and or monitoring. However, he was monitored closely for any potential problems that may arise as a result of and or during detoxification treatment. Patient tolerated his treatment regimen and detoxification treatment without any significant adverse effects and or reactions.  Patient attended treatment team meeting this am and met with the team. His symptoms, substance abuse issues, response to treatment and discharge plans discussed. Patient endorsed that he is doing well and stable for discharge to pursue the next phase of his substance abuse treatment. It was agreed that he will continue substance abuse at the [facility] on 03/10/13 at 08:30 am. This is the time a bed will be available for him. And for the meantime, patient will follow-up post hospitalization care at [facility] psychiatric clinic here in [place], [place] on 02/25/13 between the hours of 08:30 am and 12:00 Noon. He is instructed that this is a walk-in appointment. The addresses, dates, times and contact information  for both clinics provided for patient in writing. He was also encouraged to join/attend AA/NA meetings being offered and held within his community after completing his  substance abuse treatment.   Brandon Glass is currently being discharged to his place of residence. Upon discharge, patient adamantly denies suicidal, homicidal ideations, auditory, visual hallucinations, delusional thinking and or withdrawal symptoms. Patient left [facility] with all personal belongings in no apparent distress. He received 2 weeks worth samples of his discharge medications. Transportation per family.   Consults:  None  Significant Diagnostic Studies:  labs: CBC with diff, CMP, UDS, Toxicology tests, U/A  Discharge Vitals:   Blood pressure 128/82, pulse 91, temperature 98.1 F (36.7 C), temperature source Oral, resp. rate 16, height 6\' 1"  (1.854 m), weight 102.967 kg (227 lb). Body mass index is 29.96 kg/(m^2). Lab Results:   Results for orders placed during the hospital encounter of 02/20/13 (from the past 72 hour(s))  HEPATIC FUNCTION PANEL     Status: Abnormal   Collection Time    02/20/13  7:20 PM      Result Value Range   Total Protein 7.3  6.0 - 8.3 g/dL   Albumin 4.0  3.5 - 5.2 g/dL   AST 21  0 - 37 U/L   ALT 16  0 - 53 U/L   Alkaline Phosphatase 57  39 - 117 U/L   Total Bilirubin 0.2 (*) 0.3 - 1.2 mg/dL   Bilirubin, Direct <1.6  0.0 - 0.3 mg/dL   Indirect Bilirubin NOT CALCULATED  0.3 - 0.9 mg/dL    Physical Findings: AIMS: Facial and Oral Movements Muscles of Facial Expression: None, normal Lips and Perioral Area: None, normal Jaw: None, normal Tongue: None, normal,Extremity Movements Upper (arms, wrists, hands, fingers): None, normal Lower (legs, knees, ankles, toes): None, normal, Trunk Movements Neck, shoulders, hips: None, normal, Overall Severity Severity of abnormal movements (highest score from questions above): None, normal Incapacitation due to abnormal movements: None, normal Patient's awareness of abnormal movements (rate only patient's report): No Awareness, Dental Status Current problems with teeth and/or dentures?: No Does patient usually wear  dentures?: No  CIWA:  CIWA-Ar Total: 0 COWS:     Psychiatric Specialty Exam: See Psychiatric Specialty Exam and Suicide Risk Assessment completed by Attending Physician prior to discharge.  Discharge destination:  Home  Is patient on multiple antipsychotic therapies at discharge:  No   Has Patient had three or more failed trials of antipsychotic monotherapy by history:  No  Recommended Plan for Multiple Antipsychotic Therapies: NA     Medication List    TAKE these medications     Indication   traZODone 50 MG tablet  Commonly known as:  DESYREL  Take 1 tablet Q bedtime for sleep   Indication:  Trouble Sleeping, Major Depressive Disorder       Follow-up Information   Follow up with [facility] On 03/10/2013. (Be at [facility] no later than 8:30 AM on day of admit.Be certain to take 30 day supply of medication with you.  )    Contact information:   [address] [place] [address] [facility] [phone] FAX [phone]      Follow up with [facility] On 02/25/2013. (Go to walk in clinic at [facility] on Wednesday 4/30 between the hours of  8:30 and noon (the earlier you arrive, hopefully the less time you will have to wait) First visit is longest as future appointment will be set up. )  Contact information:   [address] [place], [place] [address] Ph [phone] FAX [phone]     Follow-up recommendations:  Activity:  As tolerated Diet: As recommended by your primary care doctor. Keep all scheduled follow-up appointments as recommended. Continue to work your relapse prevention plan Comments:  Take all your medications as prescribed by your mental healthcare provider. Report any adverse effects and or reactions from your medicines to your outpatient provider promptly. Patient is instructed and cautioned to not engage in alcohol and or illegal drug use while on prescription medicines. In the event of worsening symptoms, patient  is instructed to call the crisis hotline, 911 and or go to the nearest ED for appropriate evaluation and treatment of symptoms. Follow-up with your primary care provider for your other medical issues, concerns and or health care needs.   Total Discharge Time:  Greater than 30 minutes.  SignedArmandina Stammer I 02/23/2013, 3:41 PM

## 2013-02-23 NOTE — Progress Notes (Signed)
Patient did attend the evening speaker AA meeting.  

## 2013-02-23 NOTE — [facility] Suicide Risk Assessment (Signed)
Suicide Risk Assessment  Discharge Assessment     Demographic Factors:  Male and Adolescent or young adult  Mental Status Per Nursing Assessment::   On Admission:   (none)  Current Mental Status by Physician: In full contact with reality. There are no suicidal ideas plans or intent. His mood is "better" His affect is appropriate. He plans to to to [facility] on May 13. Meanwhile will stay at his parents   Loss Factors: NA  Historical Factors: NA  Risk Reduction Factors:   Sense of responsibility to family and Positive social support  Continued Clinical Symptoms:  Alcohol/Substance Abuse/Dependencies  Cognitive Features That Contribute To Risk: None identified   Suicide Risk:  Minimal: No identifiable suicidal ideation.  Patients presenting with no risk factors but with morbid ruminations; may be classified as minimal risk based on the severity of the depressive symptoms  Discharge Diagnoses:   AXIS I:  Alcohol, Cocaine Dependence, Substance Induced Mood Disorder AXIS II:  Deferred AXIS III:   Past Medical History  Diagnosis Date  . Depression    AXIS IV:  other psychosocial or environmental problems AXIS V:  61-70 mild symptoms  Plan Of Care/Follow-up recommendations:  Activity:  as tolerated Diet:  regular Follow up [facility] Is patient on multiple antipsychotic therapies at discharge:  No   Has Patient had three or more failed trials of antipsychotic monotherapy by history:  No  Recommended Plan for Multiple Antipsychotic Therapies:N/A   Vyncent Overby A 02/23/2013, 4:12 PM

## 2013-02-23 NOTE — Progress Notes (Signed)
D/C instructions/meds/follow-up appointments reviewed, pt verbalized understanding, pt's belongings returned to pt, samples given. 

## 2013-02-23 NOTE — Progress Notes (Signed)
[facility] Adult Case Management Discharge Plan :  Will you be returning to the same living situation after discharge: Yes,  parental home At discharge, do you have transportation home?:Yes,  fiancee or mother Do you have the ability to pay for your medications:Yes,  through [place] cost is affordable  Release of information consent forms completed and in the chart;  Patient's signature needed at discharge.  Patient to Follow up at: Follow-up Information   Follow up with [facility] On 03/10/2013. (Be at [facility] no later than 8:30 AM on day of admit.Be certain to take 30 day supply of medication with you.  )    Contact information:   [address] [place] [address] [facility] [phone] FAX [phone]      Follow up with [facility] On 02/25/2013. (Go to walk in clinic at [facility] on Wednesday 4/30 between the hours of  8:30 and noon (the earlier you arrive, hopefully the less time you will have to wait) First visit is longest as future appointment will be set up. )    Contact information:   [address] [place], [place] [address] Ph [phone] Valinda Hoar [phone]      Patient denies SI/HI:   Yes,  denies both    Safety Planning and Suicide Prevention discussed:  No. Suicidal Ideation was never a part of patient's presentation or an issue during hospitalization.   Clide Dales 02/23/2013, 3:22 PM

## 2013-02-23 NOTE — Progress Notes (Addendum)
D:  Per pt self inventory pt reports sleeping well, appetite good, energy level normal, ability to pay attention good, rates depression at a 1 out of 10 and hopelessness at a 1 out of 10, denies pain and any other symptoms, denies SI/HI/AVH.  A:  Emotional support provided, Encouraged pt to continue with treatment plan and attend all group activities, q15 min checks maintained for safety.  R:  Pt is calm, pleasant with staff and peers, attends group and is receptive to treatment plan.  Per treatment team Dr. Dub Mikes plans to d/c today.

## 2013-02-23 NOTE — [facility] Group Notes (Signed)
[facility] LCSW Aftercare Discharge Planning Group Note   02/23/2013 8:45 AM  Participation Quality:  Appropriate  Mood/Affect:  Appropriate  Depression Rating:  1  Anxiety Rating:  1  Thoughts of Suicide:  No Will you contract for safety?   NA  Current AVH:  No  Plan for Discharge/Comments:  Follow up at [facility] for medication management, AA and [facility] on 5/13  Transportation Means: Friend or family  Supports: Steffanie Rainwater, family, extended family  Dyane Dustman, Julious Payer

## 2013-02-26 NOTE — Progress Notes (Signed)
Patient Discharge Instructions:  After Visit Summary (AVS):   Faxed to:  02/26/13 Discharge Summary Note:   Faxed to:  02/26/13 Psychiatric Admission Assessment Note:   Faxed to:  02/26/13 Suicide Risk Assessment - Discharge Assessment:   Faxed to:  02/26/13 Faxed/Sent to the Next Level Care provider:  02/26/13 Faxed to [facility] @ [phone] Faxed to [facility] @ [phone]  Jerelene Redden, 02/26/2013, 3:56 PM

## 2015-04-23 ENCOUNTER — Emergency Department (HOSPITAL_COMMUNITY)
Admission: EM | Admit: 2015-04-23 | Discharge: 2015-04-24 | Disposition: A | Discharge status: Other Acute Inpatient Hospital | Payer: Self-pay | Attending: Emergency Medicine | Admitting: Emergency Medicine

## 2015-04-23 ENCOUNTER — Encounter (HOSPITAL_COMMUNITY): Payer: Self-pay

## 2015-04-23 DIAGNOSIS — F1310 Sedative, hypnotic or anxiolytic abuse, uncomplicated: Secondary | ICD-10-CM | POA: Insufficient documentation

## 2015-04-23 DIAGNOSIS — F1210 Cannabis abuse, uncomplicated: Secondary | ICD-10-CM | POA: Insufficient documentation

## 2015-04-23 DIAGNOSIS — F329 Major depressive disorder, single episode, unspecified: Secondary | ICD-10-CM | POA: Insufficient documentation

## 2015-04-23 DIAGNOSIS — R45851 Suicidal ideations: Secondary | ICD-10-CM

## 2015-04-23 DIAGNOSIS — R000 Tachycardia, unspecified: Secondary | ICD-10-CM | POA: Insufficient documentation

## 2015-04-23 DIAGNOSIS — F1410 Cocaine abuse, uncomplicated: Secondary | ICD-10-CM | POA: Insufficient documentation

## 2015-04-23 DIAGNOSIS — F419 Anxiety disorder, unspecified: Secondary | ICD-10-CM | POA: Insufficient documentation

## 2015-04-23 DIAGNOSIS — Z720 Tobacco use: Secondary | ICD-10-CM | POA: Insufficient documentation

## 2015-04-23 DIAGNOSIS — F1910 Other psychoactive substance abuse, uncomplicated: Secondary | ICD-10-CM

## 2015-04-23 DIAGNOSIS — R61 Generalized hyperhidrosis: Secondary | ICD-10-CM | POA: Insufficient documentation

## 2015-04-23 DIAGNOSIS — G478 Other sleep disorders: Secondary | ICD-10-CM | POA: Insufficient documentation

## 2015-04-23 LAB — ETHANOL: Alcohol, Ethyl (B): <5 mg/dL (ref <5)

## 2015-04-23 LAB — URINALYSIS, ROUTINE W REFLEX MICROSCOPIC
Bilirubin Urine: NEGATIVE
GLUCOSE, UA: NEGATIVE mg/dL
HGB URINE DIPSTICK: NEGATIVE
KETONES UR: 40 mg/dL — AB
LEUKOCYTES UA: NEGATIVE
Nitrite: NEGATIVE
PROTEIN: NEGATIVE mg/dL
SPECIFIC GRAVITY, URINE: 1.009 (ref 1.005–1.030)
Urobilinogen, UA: 1 mg/dL (ref 0.0–1.0)
pH: 7 (ref 5.0–8.0)

## 2015-04-23 LAB — RAPID URINE DRUG SCREEN, HOSP PERFORMED
Amphetamines: NOT DETECTED
Barbiturates: NOT DETECTED
Benzodiazepines: POSITIVE — AB
COCAINE: POSITIVE — AB
Opiates: NOT DETECTED
TETRAHYDROCANNABINOL: POSITIVE — AB

## 2015-04-23 LAB — COMPREHENSIVE METABOLIC PANEL
ALK PHOS: 60 U/L (ref 38–126)
ALT: 27 U/L (ref 17–63)
ANION GAP: 11 (ref 5–15)
AST: 30 U/L (ref 15–41)
Albumin: 4.7 g/dL (ref 3.5–5.0)
BUN: 13 mg/dL (ref 6–20)
CHLORIDE: 105 mmol/L (ref 101–111)
CO2: 19 mmol/L — ABNORMAL LOW (ref 22–32)
Calcium: 9.5 mg/dL (ref 8.9–10.3)
Creatinine, Ser: 1.16 mg/dL (ref 0.61–1.24)
Glucose, Bld: 90 mg/dL (ref 65–99)
Potassium: 4 mmol/L (ref 3.5–5.1)
Sodium: 135 mmol/L (ref 135–145)
Total Bilirubin: 0.8 mg/dL (ref 0.3–1.2)
Total Protein: 8.3 g/dL — ABNORMAL HIGH (ref 6.5–8.1)

## 2015-04-23 LAB — LIPASE, BLOOD: Lipase: 25 U/L (ref 22–51)

## 2015-04-23 LAB — I-STAT TROPONIN, ED: Troponin i, poc: 0.02 ng/mL (ref 0.00–0.08)

## 2015-04-23 LAB — TROPONIN I: Troponin I: <0.03 ng/mL (ref <0.031)

## 2015-04-23 MED ORDER — TRAZODONE HCL 50 MG PO TABS
50.0000 mg | ORAL_TABLET | Freq: Every evening | ORAL | Status: DC | PRN
  Filled 2015-04-23: qty 1

## 2015-04-23 MED ORDER — LORAZEPAM 1 MG PO TABS: 0.0000 mg | ORAL_TABLET | Freq: Four times a day (QID) | ORAL | Status: DC

## 2015-04-23 MED ORDER — LORAZEPAM 1 MG PO TABS: 0.0000 mg | ORAL_TABLET | Freq: Two times a day (BID) | ORAL | Status: DC

## 2015-04-23 MED ORDER — NICOTINE 21 MG/24HR TD PT24
21.0000 mg | MEDICATED_PATCH | Freq: Once | TRANSDERMAL | Status: DC
  Administered 2015-04-23: 21 mg via TRANSDERMAL
  Filled 2015-04-23: qty 1

## 2015-04-23 MED ORDER — VITAMIN B-1 100 MG PO TABS
100.0000 mg | ORAL_TABLET | Freq: Every day | ORAL | Status: DC
  Administered 2015-04-23: 100 mg via ORAL
  Filled 2015-04-23: qty 1

## 2015-04-23 MED ORDER — LORAZEPAM 2 MG/ML IJ SOLN: 0.0000 mg | Freq: Two times a day (BID) | INTRAMUSCULAR | Status: DC

## 2015-04-23 MED ORDER — LORAZEPAM 2 MG/ML IJ SOLN: 0.0000 mg | Freq: Four times a day (QID) | INTRAMUSCULAR | Status: DC

## 2015-04-23 MED ORDER — LORAZEPAM 2 MG/ML IJ SOLN
1.0000 mg | Freq: Once | INTRAMUSCULAR | Status: AC
Start: 2015-04-23 — End: 2015-04-23
  Administered 2015-04-23: 1 mg via INTRAVENOUS
  Filled 2015-04-23: qty 1

## 2015-04-23 MED ORDER — SODIUM CHLORIDE 0.9 % IV BOLUS (SEPSIS)
1000.0000 mL | Freq: Once | INTRAVENOUS | Status: AC
  Administered 2015-04-23: 1000 mL via INTRAVENOUS

## 2015-04-23 MED ORDER — THIAMINE HCL 100 MG/ML IJ SOLN: 100.0000 mg | Freq: Every day | INTRAMUSCULAR | Status: DC

## 2015-04-23 MED ORDER — LORAZEPAM 1 MG PO TABS: 1.0000 mg | ORAL_TABLET | Freq: Three times a day (TID) | ORAL | Status: DC | PRN

## 2015-04-23 NOTE — ED Notes (Signed)
Bed: RESB Expected date: 04/23/15 Expected time: 1:49 PM Means of arrival: Ambulance Comments: Palpations, cocaine use, depression,given 2.5 Versed enroute
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
## 2015-04-23 NOTE — ED Notes (Signed)
Patient is resting comfortably. 
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
## 2015-04-23 NOTE — ED Notes (Signed)
Bed: WA18 Expected date:  Expected time:  Means of arrival:  Comments: For resus B 

## 2015-04-23 NOTE — ED Notes (Signed)
Pt changed into scrubs and wanded 

## 2015-04-23 NOTE — ED Notes (Signed)
His father has arrived; and as I write this he is speaking with our T.T.S. Assessor. He is more calm and is in no distress.

## 2015-04-24 ENCOUNTER — Encounter (HOSPITAL_COMMUNITY): Payer: Self-pay | Admitting: *Deleted

## 2015-04-24 ENCOUNTER — Inpatient Hospital Stay (HOSPITAL_COMMUNITY)
Admission: AD | Admit: 2015-04-24 | Discharge: 2015-04-28 | DRG: 897 | Disposition: A | Discharge status: Home / Self Care | Payer: Federal, State, Local not specified - Other | Source: Intra-hospital | Attending: Psychiatry | Admitting: Psychiatry

## 2015-04-24 DIAGNOSIS — F1994 Other psychoactive substance use, unspecified with psychoactive substance-induced mood disorder: Secondary | ICD-10-CM

## 2015-04-24 DIAGNOSIS — R45851 Suicidal ideations: Secondary | ICD-10-CM | POA: Diagnosis present

## 2015-04-24 DIAGNOSIS — F419 Anxiety disorder, unspecified: Secondary | ICD-10-CM | POA: Diagnosis present

## 2015-04-24 DIAGNOSIS — F1910 Other psychoactive substance abuse, uncomplicated: Secondary | ICD-10-CM | POA: Diagnosis present

## 2015-04-24 DIAGNOSIS — Z62810 Personal history of physical and sexual abuse in childhood: Secondary | ICD-10-CM | POA: Diagnosis present

## 2015-04-24 DIAGNOSIS — F4310 Post-traumatic stress disorder, unspecified: Secondary | ICD-10-CM | POA: Diagnosis not present

## 2015-04-24 DIAGNOSIS — G4700 Insomnia, unspecified: Secondary | ICD-10-CM | POA: Diagnosis present

## 2015-04-24 DIAGNOSIS — F1420 Cocaine dependence, uncomplicated: Secondary | ICD-10-CM | POA: Insufficient documentation

## 2015-04-24 DIAGNOSIS — F1020 Alcohol dependence, uncomplicated: Secondary | ICD-10-CM | POA: Diagnosis present

## 2015-04-24 DIAGNOSIS — F1920 Other psychoactive substance dependence, uncomplicated: Secondary | ICD-10-CM | POA: Diagnosis present

## 2015-04-24 DIAGNOSIS — F17210 Nicotine dependence, cigarettes, uncomplicated: Secondary | ICD-10-CM | POA: Diagnosis present

## 2015-04-24 DIAGNOSIS — F332 Major depressive disorder, recurrent severe without psychotic features: Secondary | ICD-10-CM | POA: Diagnosis present

## 2015-04-24 MED ORDER — NICOTINE 21 MG/24HR TD PT24
21.0000 mg | MEDICATED_PATCH | Freq: Every day | TRANSDERMAL | Status: DC
  Administered 2015-04-25 – 2015-04-27 (×3): 21 mg via TRANSDERMAL
  Filled 2015-04-24: qty 1
  Filled 2015-04-24: qty 14
  Filled 2015-04-24 (×4): qty 1

## 2015-04-24 MED ORDER — CHLORDIAZEPOXIDE HCL 25 MG PO CAPS
25.0000 mg | ORAL_CAPSULE | Freq: Four times a day (QID) | ORAL | Status: AC
  Administered 2015-04-24 (×3): 25 mg via ORAL
  Filled 2015-04-24 (×3): qty 1

## 2015-04-24 MED ORDER — ONDANSETRON 4 MG PO TBDP: 4.0000 mg | ORAL_TABLET | Freq: Four times a day (QID) | ORAL | Status: AC | PRN

## 2015-04-24 MED ORDER — CHLORDIAZEPOXIDE HCL 25 MG PO CAPS
25.0000 mg | ORAL_CAPSULE | ORAL | Status: AC
  Administered 2015-04-26: 25 mg via ORAL
  Filled 2015-04-24 (×2): qty 1

## 2015-04-24 MED ORDER — PNEUMOCOCCAL VAC POLYVALENT 25 MCG/0.5ML IJ INJ
0.5000 mL | INJECTION | INTRAMUSCULAR | Status: AC
  Administered 2015-04-25: 0.5 mL via INTRAMUSCULAR

## 2015-04-24 MED ORDER — MAGNESIUM HYDROXIDE 400 MG/5ML PO SUSP: 30.0000 mL | Freq: Every day | ORAL | Status: DC | PRN

## 2015-04-24 MED ORDER — CHLORDIAZEPOXIDE HCL 25 MG PO CAPS: 25.0000 mg | ORAL_CAPSULE | Freq: Four times a day (QID) | ORAL | Status: AC | PRN

## 2015-04-24 MED ORDER — CHLORDIAZEPOXIDE HCL 25 MG PO CAPS
25.0000 mg | ORAL_CAPSULE | Freq: Three times a day (TID) | ORAL | Status: AC
  Administered 2015-04-25 (×3): 25 mg via ORAL
  Filled 2015-04-24 (×3): qty 1

## 2015-04-24 MED ORDER — CHLORDIAZEPOXIDE HCL 25 MG PO CAPS
25.0000 mg | ORAL_CAPSULE | Freq: Every day | ORAL | Status: AC
  Administered 2015-04-27: 25 mg via ORAL

## 2015-04-24 MED ORDER — ALUM & MAG HYDROXIDE-SIMETH 200-200-20 MG/5ML PO SUSP: 30.0000 mL | ORAL | Status: DC | PRN

## 2015-04-24 MED ORDER — HYDROXYZINE HCL 25 MG PO TABS
25.0000 mg | ORAL_TABLET | Freq: Four times a day (QID) | ORAL | Status: DC | PRN
  Administered 2015-04-24: 25 mg via ORAL
  Filled 2015-04-24: qty 1

## 2015-04-24 MED ORDER — TRAZODONE HCL 50 MG PO TABS
50.0000 mg | ORAL_TABLET | Freq: Every evening | ORAL | Status: DC | PRN
  Administered 2015-04-24 – 2015-04-27 (×3): 50 mg via ORAL
  Filled 2015-04-24 (×3): qty 1
  Filled 2015-04-24: qty 28
  Filled 2015-04-24: qty 1

## 2015-04-24 MED ORDER — ADULT MULTIVITAMIN W/MINERALS CH
1.0000 | ORAL_TABLET | Freq: Every day | ORAL | Status: DC
  Administered 2015-04-24 – 2015-04-27 (×4): 1 via ORAL
  Filled 2015-04-24 (×7): qty 1

## 2015-04-24 MED ORDER — THIAMINE HCL 100 MG/ML IJ SOLN
100.0000 mg | Freq: Once | INTRAMUSCULAR | Status: AC
  Administered 2015-04-24: 100 mg via INTRAMUSCULAR
  Filled 2015-04-24: qty 2

## 2015-04-24 MED ORDER — ACETAMINOPHEN 325 MG PO TABS
650.0000 mg | ORAL_TABLET | Freq: Four times a day (QID) | ORAL | Status: DC | PRN
  Administered 2015-04-24 – 2015-04-25 (×2): 650 mg via ORAL
  Filled 2015-04-24 (×2): qty 2

## 2015-04-24 MED ORDER — VITAMIN B-1 100 MG PO TABS
100.0000 mg | ORAL_TABLET | Freq: Every day | ORAL | Status: DC
  Administered 2015-04-25 – 2015-04-27 (×3): 100 mg via ORAL
  Filled 2015-04-24 (×5): qty 1

## 2015-04-24 NOTE — ED Notes (Signed)
Ford from [facility], called to inform that patient was accepted to [facility] under Dr. Dub Mikes into room 302-2. Report to call to nurse at [phone].

## 2015-04-24 NOTE — Tx Team (Signed)
Initial Interdisciplinary Treatment Plan   PATIENT STRESSORS: Marital or family conflict Medication change or noncompliance Substance abuse   PATIENT STRENGTHS: Average or above average intelligence Capable of independent living Motivation for treatment/growth Physical Health Supportive family/friends   PROBLEM LIST: Problem List/Patient Goals Date to be addressed Date deferred Reason deferred Estimated date of resolution  Polysubstance dependence  04/24/15     Depression 04/24/15     Suicidal 04/24/15     "Change my overall attitude, remain clean, focus on being positive, and be a better father to my son"                                     DISCHARGE CRITERIA:  Improved stabilization in mood, thinking, and/or behavior Need for constant or close observation no longer present Verbal commitment to aftercare and medication compliance Withdrawal symptoms are absent or subacute and managed without 24-hour nursing intervention  PRELIMINARY DISCHARGE PLAN: Attend 12-step recovery group Outpatient therapy Return to previous living arrangement  PATIENT/FAMIILY INVOLVEMENT: This treatment plan has been presented to and reviewed with the patient, Brandon Glass.  The patient and family have been given the opportunity to ask questions and make suggestions.  Juliann Pares 04/24/2015, 6:27 AM

## 2015-04-24 NOTE — Progress Notes (Signed)
D:Patient observed in bed majority of the shift. He denies S/S of withdrawal. He denies SI/HI. Denies AVH. He also denies physical pain. Behavior calm and cooperative on approach. Low profile most of the shift.   A: Special checks q 15 mins in place for safety. Medication administer per MD order (see eMAR). Encouragement and counseling provided.  R:Safety maintained. Will continue to monitor. Complaint with medication regimen.

## 2015-04-24 NOTE — BH Assessment (Signed)
Brandon Byes, NP accepted Pt to the service of Dr. Geoffery Glass and Binnie Rail, [facility] at [facility], confirmed room 302-2. Notified Guy Franco, RN of acceptance.   Harlin Rain Patsy Baltimore, LPC, [facility], [facility] Triage Specialist [phone]

## 2015-04-24 NOTE — Progress Notes (Signed)
[facility] Group Notes:  (Nursing/MHT/Case Management/Adjunct)  Date:  04/24/2015  Time: 0900 Type of Therapy:  Nurse Education  Participation Level:  Did Not Attend    Summary of Progress/Problems:  Brandon Glass 04/24/2015, 1:23 PM 

## 2015-04-24 NOTE — [facility] Group Notes (Signed)
[facility] Group Notes:  (Clinical Social Work)  04/24/2015  10:00-11:00AM  Summary of Progress/Problems:   The main focus of today's process group was to   1)  discuss the importance of adding supports  2)  define health supports versus unhealthy supports  3)  identify the patient's current unhealthy supports and plan how to handle them  4)  Identify the patient's current healthy supports and plan what to add.  An emphasis was placed on using counselor, doctor, therapy groups, 12-step groups, and problem-specific support groups to expand supports.    The patient expressed full comprehension of the concepts presented, and agreed that there is a need to add more supports.  The patient stated literally nothing in group, but was engaged in active listening throughout.  Type of Therapy:  Process Group with Motivational Interviewing  Participation Level:  Active  Participation Quality:  Attentive  Affect:  Blunted  Cognitive:  Alert  Insight:  Developing/Improving  Engagement in Therapy:  Engaged  Modes of Intervention:   Education, Support and Processing, Activity  Ambrose Mantle, LCSW 04/24/2015
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
## 2015-04-24 NOTE — Progress Notes (Signed)
Admission note:  Pt is a 34 year old AAM admitted to the services of Dr. Dub Mikes for polysubstance dependence and suicidal ideation.  Pt has had a falling out with his 15 year old son's mother and states he hopes to get her back once he is clean.  Pt states he has used alcohol since age 69 and has a history of black outs and seizures when drinking.  Pt also admits to using many other drugs daily when available including cocaine, THC, shrooms, opiates, crystal meth, molly.  Pt reports he stopped taking his home medications that were prescribed once he started using again.  Pt is cooperative with admission process and reports no major medical concerns.

## 2015-04-24 NOTE — Progress Notes (Signed)
Patient did attend the evening speaker AA meeting.  
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
## 2015-04-24 NOTE — Progress Notes (Addendum)
D.  Pt pleasant on approach, denies complaints at this time.  Positive for evening AA group, interacting appropriately with peers on the unit.  Denies SI/HI/hallucinations at this time.  Watched TV with peers in dayroom until hour of sleep.  Denies s/s of withdrawal at this time.   A.  Support and encouragement offered.  R.  Pt remains safe on the unit, will continue to monitor.
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
## 2015-04-25 DIAGNOSIS — F1920 Other psychoactive substance dependence, uncomplicated: Secondary | ICD-10-CM

## 2015-04-25 DIAGNOSIS — F4310 Post-traumatic stress disorder, unspecified: Secondary | ICD-10-CM | POA: Diagnosis present

## 2015-04-25 DIAGNOSIS — F332 Major depressive disorder, recurrent severe without psychotic features: Secondary | ICD-10-CM | POA: Diagnosis present

## 2015-04-25 MED ORDER — CITALOPRAM HYDROBROMIDE 20 MG PO TABS
20.0000 mg | ORAL_TABLET | Freq: Every day | ORAL | Status: DC
  Administered 2015-04-25 – 2015-04-27 (×3): 20 mg via ORAL
  Filled 2015-04-25 (×4): qty 1
  Filled 2015-04-25: qty 14

## 2015-04-25 NOTE — [facility] Group Notes (Signed)
   [facility] Planning [facility] Note  04/25/2015  8:45 AM   Participation Quality: Alert, Appropriate and Oriented  Mood/Affect: Depressed and Flat  Depression Rating: Reports high depression  Anxiety Rating: Reports high anxiety   Thoughts of Suicide: Pt denies SI/HI  Will you contract for safety? Yes  Current AVH: Pt denies  Plan for Discharge/Comments: Pt attended discharge planning group and actively participated in group. CSW provided pt with today's workbook. Patient identifies relationship issues with his child's mother as a stressor. [facility]  Transportation Means: Pt reports access to transportation  Supports: No supports mentioned at this time  Samuella BruinKristin Yocelin Vanlue, MSW, [employer]CSWA Clinical Social Worker [facility] [phone] [id number]
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
## 2015-04-25 NOTE — [facility] Group Notes (Signed)
[facility] LCSW Group Therapy 04/25/2015  1:15 pm  Type of Therapy: Group Therapy Participation Level: Active  Participation Quality: Attentive, Sharing and Supportive  Affect: Appropriate  Cognitive: Alert and Oriented  Insight: Developing/Improving and Engaged  Engagement in Therapy: Developing/Improving and Engaged  Modes of Intervention: Clarification, Confrontation, Discussion, Education, Exploration,  Limit-setting, Orientation, Problem-solving, Rapport Building, Dance movement psychotherapisteality Testing, Socialization and Support  Summary of Progress/Problems: Pt identified obstacles faced currently and processed barriers involved in overcoming these obstacles. Pt identified steps necessary for overcoming these obstacles and explored motivation (internal and external) for facing these difficulties head on. Pt further identified one area of concern in their lives and chose a goal to focus on for today. Patient identified his pride and difficulty accepting help from others as obstacles. CSW and other group members provided patient with emotional support and encouragement.  Samuella BruinKristin Rebeca Valdivia, MSW, [employer] IncLCSWA Clinical Social Worker [facility] [phone]

## 2015-04-25 NOTE — Progress Notes (Signed)
Recreation Therapy Notes  Date: 06.27.16 Time: 9:30 am Location: [address] Dayroom  Group Topic: Stress Management  Goal Area(s) Addresses:  Patient will verbalize importance of using healthy stress management.  Patient will identify positive emotions associated with healthy stress management.   Behavioral Response: Engaged  Intervention: Stress Management  Activity :  Guided Imagery Script.  LRT introduced and educated patients on stress management technique of guided imagery.  A script was used to to deliver the technique to patients.  Patients were asked to follow script read aloud by LRT to engage in the stress management technique.  Education:  Stress Management, Discharge Planning.   Education Outcome: Acknowledges edcuation/In group clarification offered/Needs additional education  Clinical Observations/Feedback: Patient attended group.  Vasilisa Vore , LRT/CTRS         Resean Brander A 04/25/2015 5:02 PM 

## 2015-04-25 NOTE — Clinical Social Work Note (Signed)
Referral faxed to [facility] for review for screening appointment at patient's request.  Carel Schnee, MSW, LCSWA Clinical Social Worker [facility] [phone]  

## 2015-04-25 NOTE — Plan of Care (Signed)
Problem: Diagnosis: Increased Risk For Suicide Attempt Goal: STG-Patient Will Report Suicidal Feelings to Staff Outcome: Progressing Patient denying SI  Problem: Alteration in mood & ability to function due to Goal: STG-Patient will comply with prescribed medication regimen (Patient will comply with prescribed medication regimen)  Outcome: Progressing Patient med compliant thus far this admit.

## 2015-04-25 NOTE — Progress Notes (Signed)
Patient up and visible in milieu. Interacting appropriately with staff and peers. Rates depression and anxiety at a 7/10, hopelessness at a 3/10. He denies any pain or physical problems and VSS. Reports his goal is to keep a positive mindset today and he plans to meet that goal by expressing himself and opening up more. Patient medicated per orders. Offered support and encouragement. No prn's requested or required thus far today. Patient currently outside with peers. Denies SI/HI and remains safe. Lawrence Marseilles

## 2015-04-26 MED ORDER — ZIPRASIDONE HCL 20 MG PO CAPS
20.0000 mg | ORAL_CAPSULE | Freq: Two times a day (BID) | ORAL | Status: DC
  Administered 2015-04-26 – 2015-04-27 (×3): 20 mg via ORAL
  Filled 2015-04-26: qty 1
  Filled 2015-04-26: qty 28
  Filled 2015-04-26 (×2): qty 1
  Filled 2015-04-26: qty 28
  Filled 2015-04-26: qty 1

## 2015-04-26 NOTE — Progress Notes (Signed)
[facility] MD Progress Note  04/26/2015 7:16 PM Brandon Glass  MRN:  [id number] Subjective:  Brandon Glass continues to be detox. He states he continues to try to get his life back together. States he has a lot of anxiety a lot of ruminations. States he cant just stop thinking and worrying and many times this leads to his relapses. He has done well on the Celexa but asks if there is something else he can take to help this. States he is really afraid of relapsing Principal Problem: Alcohol use disorder, moderate, dependence Diagnosis:   Patient Active Problem List   Diagnosis Date Noted  . Severe recurrent major depression without psychotic features [F33.2] 04/25/2015  . PTSD (post-traumatic stress disorder) [F43.10] 04/25/2015  . Substance abuse [F19.10] 04/24/2015  . Polysubstance dependence [F19.20]   . Alcohol use disorder, moderate, dependence [F10.20]   . Cocaine use disorder, moderate, dependence [F14.20]   . Polysubstance dependence, non-opioid, continuous [F19.20] 02/20/2013    Class: Chronic  . Alcohol dependence [F10.20] 02/20/2013  . Cocaine dependence [F14.20] 02/20/2013  . Substance induced mood disorder [F19.94] 02/20/2013   Total Time spent with patient: 30 minutes   Past Medical History:  Past Medical History  Diagnosis Date  . Depression    History reviewed. No pertinent past surgical history. Family History: History reviewed. No pertinent family history. Social History:  History  Alcohol Use  . 3.0 oz/week  . 5 Cans of beer per week     History  Drug Use  . Yes  . Special: Cocaine, Marijuana, MDMA (Ecstacy)    History   Social History  . Marital Status: Married    Spouse Name: N/A  . Number of Children: N/A  . Years of Education: N/A   Social History Main Topics  . Smoking status: Current Every Day Smoker -- 1.00 packs/day    Types: Cigarettes  . Smokeless tobacco: Not on file  . Alcohol Use: 3.0 oz/week    5 Cans of beer per week  . Drug Use: Yes     Special: Cocaine, Marijuana, MDMA (Ecstacy)  . Sexual Activity: Not on file   Other Topics Concern  . None   Social History Narrative   Additional History:    Sleep: Fair  Appetite:  Fair   Assessment:   Musculoskeletal: Strength & Muscle Tone: within normal limits Gait & Station: normal Patient leans: normal   Psychiatric Specialty Exam: Physical Exam  Review of Systems  Constitutional: Negative.   HENT: Negative.   Eyes: Negative.   Respiratory: Negative.   Cardiovascular: Negative.   Gastrointestinal: Negative.   Genitourinary: Negative.   Musculoskeletal: Negative.   Skin: Negative.   Neurological: Negative.   Endo/Heme/Allergies: Negative.   Psychiatric/Behavioral: Positive for depression and substance abuse. The patient is nervous/anxious and has insomnia.     Blood pressure 116/82, pulse 85, temperature 97.5 F (36.4 C), temperature source Oral, resp. rate 16, height  (1.905 m), weight 104.327 kg (230 lb).Body mass index is 28.75 kg/(m^2).  General Appearance: Fairly Groomed  Patent attorney::  Fair  Speech:  Clear and Coherent  Volume:  Increased  Mood:  Anxious, Depressed and worried  Affect:  anxious sad worried  Thought Process:  Coherent and Goal Directed  Orientation:  Full (Time, Place, and Person)  Thought Content:  symptoms envents worries concerns  Suicidal Thoughts:  No  [facility] Thoughts:  No  Memory:  Immediate;   Fair Recent;   Fair Remote;   Fair  Judgement:  Fair  Insight:  Present  Psychomotor Activity:  Restlessness  Concentration:  Fair  Recall:  [employer] of Knowledge:Fair  Language: Fair  Akathisia:  No  Handed:  Right  AIMS (if indicated):     Assets:  Desire for Improvement  ADL's:  Intact  Cognition: WNL  Sleep:  Number of Hours: 0     Current Medications: Current Facility-Administered Medications  Medication Dose Route Frequency Provider Last Rate Last Dose  . acetaminophen (TYLENOL) tablet 650 mg  650 mg  Oral Q6H PRN Charm Rings, NP   650 mg at 04/25/15 1206  . alum & mag hydroxide-simeth (MAALOX/MYLANTA) 200-200-20 MG/5ML suspension 30 mL  30 mL Oral Q4H PRN Charm Rings, NP      . chlordiazePOXIDE (LIBRIUM) capsule 25 mg  25 mg Oral Q6H PRN Adonis Brook, NP      . chlordiazePOXIDE (LIBRIUM) capsule 25 mg  25 mg Oral BH-qamhs Adonis Brook, NP   25 mg at 04/26/15 0801   Followed by  . [START ON 04/27/2015] chlordiazePOXIDE (LIBRIUM) capsule 25 mg  25 mg Oral Daily Adonis Brook, NP      . citalopram (CELEXA) tablet 20 mg  20 mg Oral Daily Rachael Fee, MD   20 mg at 04/26/15 0800  . hydrOXYzine (ATARAX/VISTARIL) tablet 25 mg  25 mg Oral Q6H PRN Charm Rings, NP   25 mg at 04/24/15 2147  . magnesium hydroxide (MILK OF MAGNESIA) suspension 30 mL  30 mL Oral Daily PRN Charm Rings, NP      . multivitamin with minerals tablet 1 tablet  1 tablet Oral Daily Adonis Brook, NP   1 tablet at 04/26/15 0801  . nicotine (NICODERM CQ - dosed in mg/24 hours) patch 21 mg  21 mg Transdermal Daily Adonis Brook, NP   21 mg at 04/26/15 0802  . ondansetron (ZOFRAN-ODT) disintegrating tablet 4 mg  4 mg Oral Q6H PRN Adonis Brook, NP      . thiamine (VITAMIN B-1) tablet 100 mg  100 mg Oral Daily Adonis Brook, NP   100 mg at 04/26/15 0801  . traZODone (DESYREL) tablet 50 mg  50 mg Oral QHS PRN,MR X 1 Charm Rings, NP   50 mg at 04/25/15 2142  . ziprasidone (GEODON) capsule 20 mg  20 mg Oral BID WC Rachael Fee, MD   20 mg at 04/26/15 1633    Lab Results: No results found for this or any previous visit (from the past 48 hour(s)).  Physical Findings: AIMS: Facial and Oral Movements Muscles of Facial Expression: None, normal Lips and Perioral Area: None, normal Jaw: None, normal Tongue: None, normal,Extremity Movements Upper (arms, wrists, hands, fingers): None, normal Lower (legs, knees, ankles, toes): None, normal, Trunk Movements Neck, shoulders, hips: None, normal, Overall  Severity Severity of abnormal movements (highest score from questions above): None, normal Incapacitation due to abnormal movements: None, normal Patient's awareness of abnormal movements (rate only patient's report): No Awareness, Dental Status Current problems with teeth and/or dentures?: No Does patient usually wear dentures?: No  CIWA:  CIWA-Ar Total: 0 COWS:  COWS Total Score: 4  Treatment Plan Summary: Daily contact with patient to assess and evaluate symptoms and progress in treatment and Medication management Supportive approach/coping skills Alcohol dependence; continue the Librium detox protocol Depression; continue the Celexa at 20 mg Anxiety/ruminative thinking; will start a trial with Geodon 20 mg BID Work a relapse prevention plan Explore residential treatment options  Medical Decision  Making:  Review of Psycho-Social Stressors (1), Review of Medication Regimen & Side Effects (2) and Review of New Medication or Change in Dosage (2)     Rashod Gougeon A 04/26/2015, 7:16 PM

## 2015-04-26 NOTE — [facility] Suicide Risk Assessment (Signed)
[facility] INPATIENT:  Family/Significant Other Suicide Prevention Education  Suicide Prevention Education:  Education Completed; Patrick NorthHannah Boyd, Pt's ex-girlfriend, has been identified by the patient as the family member/significant other with whom the patient will be residing, and identified as the person(s) who will aid the patient in the event of a mental health crisis (suicidal ideations/suicide attempt).  With written consent from the patient, the family member/significant other has been provided the following suicide prevention education, prior to the and/or following the discharge of the patient.  The suicide prevention education provided includes the following:  Suicide risk factors  Suicide prevention and interventions  National Suicide Hotline telephone number  [facility] assessment telephone number  [facility] Emergency Assistance 911  [facility] and/or Residential Mobile Crisis Unit telephone number  Request made of family/significant other to:  Remove weapons (e.g., guns, rifles, knives), all items previously/currently identified as safety concern.    Remove drugs/medications (over-the-counter, prescriptions, illicit drugs), all items previously/currently identified as a safety concern.  The family member/significant other verbalizes understanding of the suicide prevention education information provided.  The family member/significant other agrees to remove the items of safety concern listed above.  Elaina Hoopsarter, Marisa Hufstetler M 04/26/2015, 5:12 PM

## 2015-04-26 NOTE — Progress Notes (Signed)
Pt was observed sitting in the dayroom watching TV and talking with peers before AA group.  After group, pt was sitting alone in the group room at the piano when writer went to talk with him.  He reports he is doing fine and that [facility]  He denies SI/HI/AVH at this time.  He reports he is not having any withdrawal symptoms this evening.  He was reminded of his prn medications that are available if he should develop any w/d symptoms.  Pt is pleasant and cooperative with staff.  Support and encouragement offered.  Pt voices no needs or concerns at this time. Safety maintained with q15 minute checks.

## 2015-04-26 NOTE — Progress Notes (Signed)
Recreation Therapy Notes  Animal-Assisted Activity (AAA) Program Checklist/Progress Notes Patient Eligibility Criteria Checklist & Daily Group note for Rec Tx Intervention  Date: 06.28.16 Time: 2:45 pm Location: [address] Dayroom  AAA/T Program Assumption of Risk Form signed by Patient/ or Parent Legal Guardian yes  Patient is free of allergies or sever asthma yes  Patient reports no fear of animals yes  Patient reports no history of cruelty to animalsyes  Patient understands his/her participation is voluntary yes  Patient washes hands before animal contact yes  Patient washes hands after animal contact yes  Education: Hand Washing, Appropriate Animal Interaction   Clinical Observations/Feedback: Patient did not attend group.   Lucy Woolever, LRT/CTRS         Brandon Glass 04/26/2015 4:10 PM 

## 2015-04-26 NOTE — Progress Notes (Signed)
Pt was invited to group, however did not attend and stayed in his room. 

## 2015-04-26 NOTE — [facility] Group Notes (Signed)
Patient attended nursing education group. Was appropriate, attentive and participative. Brandon Glass, Brandon Glass

## 2015-04-26 NOTE — Progress Notes (Signed)
Pt has been asleep in bed since the beginning of the shift at 1900.  A check of the MAR and according to report, pt started Geodon this afternoon.  He seems to be resting.  Eyes are closed.  No distress observed.  Respirations even and unlabored.  Safety maintained with q15 minute checks.

## 2015-04-26 NOTE — Plan of Care (Signed)
Problem: Alteration in mood & ability to function due to Goal: STG-Patient will attend groups Outcome: Progressing Patient is attending groups and participates appropriately.

## 2015-04-26 NOTE — Progress Notes (Signed)
Patient is up and visible in the milieu. Affect and mood both appropriate. He rates his depression at a 3/10, hopelessness at 0/10 and anxiety at a 2/10. He reports his goal today is "to stay positive mentally" and he plans to meet that goal "by speaking to others around me for advice." He denies pain or physical problems. Medicated per orders. Offered emotional support and encouragement. Denies SI/HI and remains safe. Lawrence MarseillesFriedman, Latecia Miler Eakes

## 2015-04-26 NOTE — [facility] Group Notes (Signed)
[facility] Group Therapy 04/26/2015 1:15pm  Type of Therapy: Mental Health Association Presentation  Participation Level: Active  Participation Quality: Attentive  Affect: Appropriate  Cognitive: Oriented  Insight: Developing/Improving  Engagement in Therapy: Engaged  Modes of Intervention: Discussion, Education and Socialization  Summary of Progress/Problems: Mental Health Association (MHA) Speaker came to talk about his personal journey with substance abuse and addiction. The pt processed ways by which to relate to the speaker. MHA speaker provided handouts and educational information pertaining to groups and services offered by the [facility]. Pt was engaged in speaker's presentation and was receptive to resources provided.    Brandon CordialLauren Glass, LCSWA 04/26/2015 1:32 PM

## 2015-04-26 NOTE — Tx Team (Signed)
Interdisciplinary Treatment Plan Update (Adult) Date: 04/26/2015   Date: 04/26/2015 9:51 AM  Progress in Treatment:  Attending groups: Yes  Participating in groups: Yes  Taking medication as prescribed: Yes  Tolerating medication: Yes  Family/Significant othe contact made:  Patient understands diagnosis:  Discussing patient identified problems/goals with staff: Yes  Medical problems stabilized or resolved: Yes  Denies suicidal/homicidal ideation:  Patient has not harmed self or Others: Yes   New problem(s) identified: None identified at this time.   Discharge Plan or Barriers: [facility] abuse treatment.   Additional comments:  Brandon GoldRussell is a 34yo male hospitalized with SI with plan to use substances in increasing amounts to die. He has been using daily marijuana (7-10 blunts), cocaine powder (1/2 of an 8-ball daily), Molly (0.5 gm), cough syrup (2 oz nightly), alcohol ([place] daily 1/5th), crystal meth (0.5 gram) and has also used Xanax and Vicodin, but not daily. Current stressors include conflict with 5yo son's mother. Used to go to [facility], [facility], [facility] because was kicked out 2 years ago and they told him not to return  Reason for Continuation of Hospitalization:  Anxiety Depression Medical Issues Medication stabilization Suicidal ideation Withdrawal symptoms Psychotic  Estimated length of stay: 3-5 days  For review of initial/current patient goals, please see plan of care.   Attendees:  Patient:    Family:    Physician: Dr. Dub MikesLugo MD  04/26/2015 9:42 AM  Nursing: Onnie BoerJennifer Clark, RN Case manager  04/26/2015 9:42 AM  Clinical Social Worker Lamar SprinklesLauren Carter, LCSWA, MSW 04/26/2015 9:42 AM  Other: Leisa LenzValerie Enoch, [facility] 04/26/2015 9:42 AM  Clinical:  Keitha ButteAndrea Thorn, RN; Merian CapronMarian Friedman, RN; Waynetta SandyJan Wright, RN 04/26/2015 9:42 AM  Other: , RN Charge Nurse 04/26/2015 9:42 AM  Other:      Chad CordialLauren Carter,  Theresia MajorsLCSWA MSW

## 2015-04-27 MED ORDER — CITALOPRAM HYDROBROMIDE 20 MG PO TABS: 20.0000 mg | ORAL_TABLET | Freq: Every day | ORAL | Status: DC

## 2015-04-27 MED ORDER — NICOTINE 21 MG/24HR TD PT24: 21.0000 mg | MEDICATED_PATCH | Freq: Every day | TRANSDERMAL | Status: DC

## 2015-04-27 MED ORDER — TRAZODONE HCL 50 MG PO TABS: 50.0000 mg | ORAL_TABLET | Freq: Every evening | ORAL | Status: DC | PRN

## 2015-04-27 MED ORDER — CHLORDIAZEPOXIDE HCL 25 MG PO CAPS: 25.0000 mg | ORAL_CAPSULE | Freq: Three times a day (TID) | ORAL | Status: DC

## 2015-04-27 MED ORDER — ZIPRASIDONE HCL 20 MG PO CAPS: 20.0000 mg | ORAL_CAPSULE | Freq: Two times a day (BID) | ORAL | Status: DC

## 2015-04-27 NOTE — Progress Notes (Signed)
[facility] Progress [facility]  04/27/2015 7:24 PM Brandon Glass  MRN:  [id number] Subjective:  Brandon Glass states he is starting to feel better. He has tolerated the medications well. He is trying to stay focus on himself and getting healthier. States when he starts thinking about the outside gets more anxious upset. Working on developing ways of getting himself back on track when his mind wonders Principal Problem: Alcohol use disorder, moderate, dependence Diagnosis:   Patient Active Problem List   Diagnosis Date Noted  . Severe recurrent major depression without psychotic features [F33.2] 04/25/2015  . PTSD (post-traumatic stress disorder) [F43.10] 04/25/2015  . Substance abuse [F19.10] 04/24/2015  . Polysubstance dependence [F19.20]   . Alcohol use disorder, moderate, dependence [F10.20]   . Cocaine use disorder, moderate, dependence [F14.20]   . Polysubstance dependence, non-opioid, continuous [F19.20] 02/20/2013    Class: Chronic  . Alcohol dependence [F10.20] 02/20/2013  . Cocaine dependence [F14.20] 02/20/2013  . Substance induced mood disorder [F19.94] 02/20/2013   Total Time spent with patient: 30 minutes   Past Medical History:  Past Medical History  Diagnosis Date  . Depression    History reviewed. No pertinent past surgical history. Family History: History reviewed. No pertinent family history. Social History:  History  Alcohol Use  . 3.0 oz/week  . 5 Cans of beer per week     History  Drug Use  . Yes  . Special: Cocaine, Marijuana, MDMA (Ecstacy)    History   Social History  . Marital Status: Married    Spouse Name: N/A  . Number of Children: N/A  . Years of Education: N/A   Social History Main Topics  . Smoking status: Current Every Day Smoker -- 1.00 packs/day    Types: Cigarettes  . Smokeless tobacco: Not on file  . Alcohol Use: 3.0 oz/week    5 Cans of beer per week  . Drug Use: Yes    Special: Cocaine, Marijuana, MDMA (Ecstacy)  . Sexual Activity: Not  on file   Other Topics Concern  . None   Social History Narrative   Additional History:    Sleep: Fair  Appetite:  Fair   Assessment:   Musculoskeletal: Strength & Muscle Tone: within normal limits Gait & Station: normal Patient leans: normal   Psychiatric Specialty Exam: Physical Exam  Review of Systems  Constitutional: Negative.   HENT: Negative.   Eyes: Negative.   Respiratory: Negative.   Cardiovascular: Negative.   Gastrointestinal: Negative.   Genitourinary: Negative.   Musculoskeletal: Negative.   Skin: Negative.   Neurological: Negative.   Endo/Heme/Allergies: Negative.   Psychiatric/Behavioral: Positive for depression and substance abuse.    Blood pressure 126/71, pulse 87, temperature 97.9 F (36.6 C), temperature source Oral, resp. rate 20, height 6\' 3"  (1.905 m), weight 104.327 kg (230 lb).Body mass index is 28.75 kg/(m^2).  General Appearance: Fairly Groomed  Patent attorneyye Contact::  Fair  Speech:  Clear and Coherent  Volume:  Normal  Mood:  Anxious and worried  Affect:  anxious worried  Thought Process:  Coherent and Goal Directed  Orientation:  Full (Time, Place, and Person)  Thought Content:  symptoms events worries concerns  Suicidal Thoughts:  No  Homicidal Thoughts:  No  Memory:  Immediate;   Fair Recent;   Fair Remote;   Fair  Judgement:  Fair  Insight:  Present  Psychomotor Activity:  Restlessness  Concentration:  Fair  Recall:  FiservFair  Fund of Knowledge:Fair  Language: Fair  Akathisia:  No  Handed:  Right  AIMS (if indicated):     Assets:  Desire for Improvement  ADL's:  Intact  Cognition: WNL  Sleep:  Number of Hours: 6     Current Medications: Current Facility-Administered Medications  Medication Dose Route Frequency Provider Last Rate Last Dose  . acetaminophen (TYLENOL) tablet 650 mg  650 mg Oral Q6H PRN Charm Rings, NP   650 mg at 04/25/15 1206  . alum & mag hydroxide-simeth (MAALOX/MYLANTA) 200-200-20 MG/5ML suspension 30  mL  30 mL Oral Q4H PRN Charm Rings, NP      . citalopram (CELEXA) tablet 20 mg  20 mg Oral Daily Rachael Fee, MD   20 mg at 04/27/15 0755  . hydrOXYzine (ATARAX/VISTARIL) tablet 25 mg  25 mg Oral Q6H PRN Charm Rings, NP   25 mg at 04/24/15 2147  . magnesium hydroxide (MILK OF MAGNESIA) suspension 30 mL  30 mL Oral Daily PRN Charm Rings, NP      . multivitamin with minerals tablet 1 tablet  1 tablet Oral Daily Adonis Brook, NP   1 tablet at 04/27/15 0755  . nicotine (NICODERM CQ - dosed in mg/24 hours) patch 21 mg  21 mg Transdermal Daily Adonis Brook, NP   21 mg at 04/27/15 0756  . thiamine (VITAMIN B-1) tablet 100 mg  100 mg Oral Daily Adonis Brook, NP   100 mg at 04/27/15 0755  . traZODone (DESYREL) tablet 50 mg  50 mg Oral QHS PRN,MR X 1 Charm Rings, NP   50 mg at 04/25/15 2142  . ziprasidone (GEODON) capsule 20 mg  20 mg Oral BID WC Rachael Fee, MD   20 mg at 04/27/15 1657    Lab Results: No results found for this or any previous visit (from the past 48 hour(s)).  Physical Findings: AIMS: Facial and Oral Movements Muscles of Facial Expression: None, normal Lips and Perioral Area: None, normal Jaw: None, normal Tongue: None, normal,Extremity Movements Upper (arms, wrists, hands, fingers): None, normal Lower (legs, knees, ankles, toes): None, normal, Trunk Movements Neck, shoulders, hips: None, normal, Overall Severity Severity of abnormal movements (highest score from questions above): None, normal Incapacitation due to abnormal movements: None, normal Patient's awareness of abnormal movements (rate only patient's report): No Awareness, Dental Status Current problems with teeth and/or dentures?: No Does patient usually wear dentures?: No  CIWA:  CIWA-Ar Total: 0 COWS:  COWS Total Score: 4  Treatment Plan Summary: Daily contact with patient to assess and evaluate symptoms and progress in treatment and Medication management Supportive approach/coping  skills Polysubstance dependence; complete the detox/work a relapse prevention plan Anxiety/ruminating; will pursue the Geodon further Depression; will continue the Celexa 20 mg daily with Geodon augmentation Facilitate admission to [facility]  Medical Decision Making:  Review of Psycho-Social Stressors (1), Review of Medication Regimen & Side Effects (2) and Review of New Medication or Change in Dosage (2)     Swara Donze A 04/27/2015, 7:24 PM

## 2015-04-27 NOTE — Plan of Care (Signed)
Problem: Alteration in mood & ability to function due to Goal: STG: Patient verbalizes decreases in signs of withdrawal Outcome: Completed/Met Date Met:  04/27/15 Pt reports he is not having any withdrawal symptoms at this point.  He feels the medications are helping him.

## 2015-04-27 NOTE — Plan of Care (Signed)
Problem: Alteration in mood & ability to function due to Goal: LTG-Patient demonstrates decreased signs of withdrawal 6/27: Goal met: Patient demonstrates decreased signs of withdrawal to the point the patient is safe to return home and continue treatment in an outpatient setting. No withdrawal symptoms reported at this time per medical chart.   Tilden Fossa, MSW, [facility] Clinical Social Worker [facility] [phone] Outcome: Completed/Met Date Met:  04/27/15 See note By Barrie Lyme D.

## 2015-04-27 NOTE — Progress Notes (Signed)
Patient ID: Brandon Glass, male   DOB: 11/24/1980, 34 y.o.   MRN: [id number]  DAR: Pt. Denies SI/HI and A/V Hallucinations. Patient reports that his sleep last night was good, appetite is good, energy level is normal, and concentration level is good. Patient rates his depression 2/10, hopelessness 1/10, and anxiety 8/10. Patient does not report any pain or discomfort at this time. Support and encouragement provided to the patient. Scheduled medications administered to patient per physician's orders. Patient is receptive and cooperative but minimal with Clinical research associatewriter. Patient is seen in the milieu interacting with peers and is going to some groups. Q15 minute checks are maintained for safety.

## 2015-04-27 NOTE — Progress Notes (Signed)
Recreation Therapy Notes  Date: 06.29.16 Time: 9:30 am Location: [address] Dayroom  Group Topic: Stress Management  Goal Area(s) Addresses:  Patient will verbalize importance of using healthy stress management.  Patient will identify positive emotions associated with healthy stress management.   Behavioral Response: Engaged  Intervention: Stress Management  Activity : Progressive Muscle Relaxation. LRT introduced and educated patients on stress management technique of progressive muscle relaxation. A script was used to to deliver the technique to patients. Patients were asked to follow script read aloud by LRT to engage in the stress management technique.  Education: Stress Management, Discharge Planning.   Education Outcome: Acknowledges edcuation/In group clarification offered/Needs additional education  Clinical Observations/Feedback: Patient attended group.  Caroll RancherMarjette Derryl Uher , LRT/CTRS        Caroll RancherLindsay, Citlali Gautney A 04/27/2015 3:07 PM

## 2015-04-27 NOTE — Progress Notes (Signed)
Pt attended the NA speaker meeting. Pt was engaged and participated when asked to read aloud. 

## 2015-04-27 NOTE — [facility] Group Notes (Signed)
[facility] LCSW Group Therapy 04/27/2015 1:15 PM  Type of Therapy: Group Therapy- Emotion Regulation  Participation Level: Minimal  Participation Quality:  Minimal  Affect: Lethargic  Cognitive: Alert and Oriented   Insight:  Developing/Improving  Engagement in Therapy: Developing/Improving and Engaged   Modes of Intervention: Clarification, Confrontation, Discussion, Education, Exploration, Limit-setting, Orientation, Problem-solving, Rapport Building, Dance movement psychotherapisteality Testing, Socialization and Support  Summary of Progress/Problems: The topic for group today was emotional regulation. This group focused on both positive and negative emotion identification and allowed group members to process ways to identify feelings, regulate negative emotions, and find healthy ways to manage internal/external emotions. Group members were asked to reflect on a time when their reaction to an emotion led to a negative outcome and explored how alternative responses using emotion regulation would have benefited them. Group members were also asked to discuss a time when emotion regulation was utilized when a negative emotion was experienced. Pt did not participate verbally in group discussion, struggled to stay awake. Pt left group early.   Chad CordialLauren Carter, LCSWA 04/27/2015 2:18 PM

## 2015-04-27 NOTE — [facility] Group Notes (Signed)
[facility]  04/27/2015 8:45 AM  Participation Quality: Alert, Appropriate and Oriented  Mood/Affect: Appropriate  Depression Rating: 0  Anxiety Rating: 8  Thoughts of Suicide: Pt denies SI/HI  Will you contract for safety? Yes  Current AVH: Pt denies  Plan for Discharge/Comments: Pt attended discharge planning group and actively participated in group. CSW discussed suicide prevention education with the group and encouraged them to discuss discharge planning and any relevant barriers. Pt reports that his anxiety rating is up due to anticipation of leaving hospital and starting treatment. Pt reports having a supportive brother.  Transportation Means: Pt reports access to transportation  Supports: Brother  Chad CordialLauren Carter, Theresia MajorsLCSWA 04/27/2015 12:54 PM

## 2015-04-27 NOTE — Progress Notes (Signed)
  [facility] Discharge [facility] :  Will you be returning to the same living situation after discharge:  No. [facility] is [facility] abuse treatment At discharge, do you have transportation home?: Yes,  Pt provided with taxi voucher Do you have the ability to pay for your medications: Yes,  Pt provided with 2-week supply and prescriptions  Release of information consent forms completed and in the chart;  Patient's signature needed at discharge.  Patient to Follow up at: Follow-up Information    [facility]  On 04/28/2015.   Why:  Admission assessment on Thursday June 30th at 8 am. Please bring change of clothing, medications, and [place] ID with you. Call office if you need to reschedule.    Contact information:   [address] [place] [id number] [phone]      Patient denies SI/HI: Yes,  Pt denies    Safety Planning and Suicide Prevention discussed: Yes,  with ex-girlfriend. See SPE note for further details  Have you used any form of tobacco in the last 30 days? (Cigarettes, Smokeless Tobacco, Cigars, and/or Pipes): Yes  Has patient been referred to the Quitline?: Yes, faxed on 04/27/15  Elaina HoopsCarter, Tjuana Vickrey M 04/27/2015, 3:24 PM
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
## 2015-04-27 NOTE — Discharge Summary (Signed)
Physician Discharge Summary Note  Patient:  Brandon Glass is an 34 y.o., male MRN:  [id number] DOB:  [birth year] Patient phone:  [phone] (home)  Patient address:   [address]  [place] [address],  Total Time spent with patient: 45 minutes  Date of Admission:  04/24/2015 Date of Discharge: 04/27/2015  Reason for Admission:  Suicidal ideation  Principal Problem: Alcohol use disorder, moderate, dependence Discharge Diagnoses: Patient Active Problem List   Diagnosis Date Noted  . Severe recurrent major depression without psychotic features [F33.2] 04/25/2015  . PTSD (post-traumatic stress disorder) [F43.10] 04/25/2015  . Substance abuse [F19.10] 04/24/2015  . Polysubstance dependence [F19.20]   . Alcohol use disorder, moderate, dependence [F10.20]   . Cocaine use disorder, moderate, dependence [F14.20]   . Polysubstance dependence, non-opioid, continuous [F19.20] 02/20/2013    Class: Chronic  . Alcohol dependence [F10.20] 02/20/2013  . Cocaine dependence [F14.20] 02/20/2013  . Substance induced mood disorder [F19.94] 02/20/2013    Musculoskeletal: Strength & Muscle Tone: within normal limits Gait & Station: normal Patient leans: N/A  Psychiatric Specialty Exam:  SEE SRA Physical Exam  Vitals reviewed.   ROS  Blood pressure 126/71, pulse 87, temperature 97.9 F (36.6 C), temperature source Oral, resp. rate 20, height  (1.905 m), weight 104.327 kg (230 lb).Body mass index is 28.75 kg/(m^2).  Have you used any form of tobacco in the last 30 days? (Cigarettes, Smokeless Tobacco, Cigars, and/or Pipes): Yes  Has this patient used any form of tobacco in the last 30 days? (Cigarettes, Smokeless Tobacco, Cigars, and/or Pipes) Yes, A prescription for an FDA-approved tobacco cessation medication was offered at discharge and the patient refused samples and Rx given  Past Medical History:  Past Medical History  Diagnosis Date  . Depression    History  reviewed. No pertinent past surgical history. Family History: History reviewed. No pertinent family history. Social History:  History  Alcohol Use  . 3.0 oz/week  . 5 Cans of beer per week     History  Drug Use  . Yes  . Special: Cocaine, Marijuana, MDMA (Ecstacy)    History   Social History  . Marital Status: Married    Spouse Name: N/A  . Number of Children: N/A  . Years of Education: N/A   Social History Main Topics  . Smoking status: Current Every Day Smoker -- 1.00 packs/day    Types: Cigarettes  . Smokeless tobacco: Not on file  . Alcohol Use: 3.0 oz/week    5 Cans of beer per week  . Drug Use: Yes    Special: Cocaine, Marijuana, MDMA (Ecstacy)  . Sexual Activity: Not on file   Other Topics Concern  . None   Social History Narrative   Risk to Self: Is patient at risk for suicide?: Yes What has been your use of drugs/alcohol within the last 12 months?: Had been clean 10 months 2 years ago.  Has been using daily:  marijuana (7-10 blunts), cocaine powder (1/2 of an 8-ball daily), Molly (0.5 gm), cough syrup (2 oz nightly), alcohol ([place] daily 1/5th), crystal meth since 3 weeks ago has been using daily (0.5 gram).  Has also used Xanax and Vicodin but not daily. Risk to Others:   Prior Inpatient Therapy:   Prior Outpatient Therapy:    Level of Care:  OP  Hospital Course:   WARWICK NICK is an 34 y.o. Male was initially seen at [facility] endorsing SI, depression  And anxiety.  He reports he  relapsed on several drugs and alcohol in Feb 2016 after "falling out" w/ his 465 yo son's mom . Pt says that for the past two months, pt has been trying to kill himself by ingesting as many substances as possible.  He endorses depressive symptoms and severe anxiety. Pt reports guilt, loss of interest in usual pleasures, worthlessness and irritability. Pt states he is living with brother and sister in law.   [name] E Strothman was admitted for Alcohol use disorder, moderate, dependence  and crisis management.  He was treated discharged with the medications listed below under Medication List.  Medical problems were identified and treated as needed.  Home medications were restarted as appropriate.  Improvement was monitored by observation and Lowella Dellussell E Stargell daily report of symptom reduction.  Emotional and mental status was monitored by daily self-inventory reports completed by Lowella Dellussell E Bastarache and clinical staff.         Lowella Dellussell E Nibert was evaluated by the treatment team for stability and plans for continued recovery upon discharge.  [name] E Bartoletti motivation was an integral factor for scheduling further treatment.  Employment, transportation, bed availability, health status, family support, and any pending legal issues were also considered during his hospital stay.  He was offered further treatment options upon discharge including but not limited to Residential, Intensive Outpatient, and Outpatient treatment.  Lowella Dellussell E Essex will follow up with the services as listed below under Follow Up Information.     Upon completion of this admission the patient was both mentally and medically stable for discharge denying suicidal/homicidal ideation, auditory/visual/tactile hallucinations, delusional thoughts and paranoia.       Consults:  psychiatry  Significant Diagnostic Studies:  labs: per ED  Discharge Vitals:   Blood pressure 126/71, pulse 87, temperature 97.9 F (36.6 C), temperature source Oral, resp. rate 20, height 6\' 3"  (1.905 m), weight 104.327 kg (230 lb). Body mass index is 28.75 kg/(m^2). Lab Results:   No results found for this or any previous visit (from the past 72 hour(s)).  Physical Findings: AIMS: Facial and Oral Movements Muscles of Facial Expression: None, normal Lips and Perioral Area: None, normal Jaw: None, normal Tongue: None, normal,Extremity Movements Upper (arms, wrists, hands, fingers): None, normal Lower (legs, knees, ankles, toes): None, normal,  Trunk Movements Neck, shoulders, hips: None, normal, Overall Severity Severity of abnormal movements (highest score from questions above): None, normal Incapacitation due to abnormal movements: None, normal Patient's awareness of abnormal movements (rate only patient's report): No Awareness, Dental Status Current problems with teeth and/or dentures?: No Does patient usually wear dentures?: No  CIWA:  CIWA-Ar Total: 0 COWS:  COWS Total Score: 4   See Psychiatric Specialty Exam and Suicide Risk Assessment completed by Attending Physician prior to discharge.  Discharge destination:  Home  Is patient on multiple antipsychotic therapies at discharge:  No   Has Patient had three or more failed trials of antipsychotic monotherapy by history:  No    Recommended Plan for Multiple Antipsychotic Therapies: NA     Medication List    STOP taking these medications        XANAX PO      TAKE these medications      Indication   citalopram 20 MG tablet  Commonly known as:  CELEXA  Take 1 tablet (20 mg total) by mouth daily.   Indication:  Depression     nicotine 21 mg/24hr patch  Commonly known as:  NICODERM CQ - dosed in mg/24 hours  Place 1 patch (21 mg total) onto the skin daily.   Indication:  Nicotine Addiction     traZODone 50 MG tablet  Commonly known as:  DESYREL  Take 1 tablet (50 mg total) by mouth at bedtime as needed and may repeat dose one time if needed for sleep.   Indication:  Trouble Sleeping     ziprasidone 20 MG capsule  Commonly known as:  GEODON  Take 1 capsule (20 mg total) by mouth 2 (two) times daily with a meal.   Indication:  Manic-Depression           Follow-up Information    Follow up with [facility]  On 04/28/2015.   Why:  Admission assessment on Thursday June 30th at 8 am. Please bring change of clothing, medications, and [place] ID with you. Call office if you need to reschedule.    Contact information:   [address] [facility]  [place]. [place] [address] [phone]      Follow-up recommendations:  Activity:  as tol, diet as tol  Comments:  1.  Take all your medications as prescribed.              2.  Report any adverse side effects to outpatient provider.                       3.  Patient instructed to not use alcohol or illegal drugs while on prescription medicines.            4.  In the event of worsening symptoms, instructed patient to call 911, the crisis hotline or go to nearest emergency room for evaluation of symptoms.  Total Discharge Time: 40 min  Signed: Velna Hatchet May Agustin AGNP-BC 04/27/2015, 2:40 PM  I personally assessed the patient and formulated the plan Madie Reno A. Dub Mikes, M.D.

## 2015-04-28 NOTE — Progress Notes (Signed)
Pt reports he has had a good day and feels his medications are working well.  He denies SI/HI/AVH.  He is not having any withdrawal symptoms today.  He reports he is discharging tomorrow to [facility]  He has been pleasant and cooperative with staff and peers.  Pt makes his needs known to staff.  Support and encouragement offered.  Safety maintained with q15 minute checks.

## 2015-04-28 NOTE — Progress Notes (Signed)
Pt was discharged this morning to go [facility] [employer]  All contents of patient's locker was returned to him along with items brought to him by his brother last night.  All paperwork was signed.  Discharge instructions were reviewed with the patient and he was given a two week supply of his medications to [facility].  Pt is looking forward to the next step in recovery.  Pt voiced no needs or concerns on his discharge.

## 2015-10-14 ENCOUNTER — Encounter (HOSPITAL_COMMUNITY): Payer: Self-pay

## 2015-10-14 ENCOUNTER — Emergency Department (HOSPITAL_COMMUNITY)
Admission: EM | Admit: 2015-10-14 | Discharge: 2015-10-14 | Discharge status: Against Medical Advice | Payer: Self-pay | Attending: Emergency Medicine | Admitting: Emergency Medicine

## 2015-10-14 ENCOUNTER — Emergency Department (HOSPITAL_COMMUNITY): Payer: Self-pay

## 2015-10-14 DIAGNOSIS — F329 Major depressive disorder, single episode, unspecified: Secondary | ICD-10-CM | POA: Insufficient documentation

## 2015-10-14 DIAGNOSIS — F1920 Other psychoactive substance dependence, uncomplicated: Secondary | ICD-10-CM

## 2015-10-14 DIAGNOSIS — R079 Chest pain, unspecified: Secondary | ICD-10-CM | POA: Insufficient documentation

## 2015-10-14 DIAGNOSIS — F419 Anxiety disorder, unspecified: Secondary | ICD-10-CM | POA: Insufficient documentation

## 2015-10-14 DIAGNOSIS — R0602 Shortness of breath: Secondary | ICD-10-CM | POA: Insufficient documentation

## 2015-10-14 DIAGNOSIS — F17210 Nicotine dependence, cigarettes, uncomplicated: Secondary | ICD-10-CM | POA: Insufficient documentation

## 2015-10-14 DIAGNOSIS — R002 Palpitations: Secondary | ICD-10-CM | POA: Insufficient documentation

## 2015-10-14 DIAGNOSIS — F1520 Other stimulant dependence, uncomplicated: Secondary | ICD-10-CM | POA: Insufficient documentation

## 2015-10-14 DIAGNOSIS — Z79899 Other long term (current) drug therapy: Secondary | ICD-10-CM | POA: Insufficient documentation

## 2015-10-14 DIAGNOSIS — F1420 Cocaine dependence, uncomplicated: Secondary | ICD-10-CM | POA: Insufficient documentation

## 2015-10-14 HISTORY — DX: Anxiety disorder, unspecified: F41.9

## 2015-10-14 HISTORY — DX: Other psychoactive substance abuse, uncomplicated: F19.10

## 2015-10-14 LAB — CBC
HCT: 43.5 % (ref 39.0–52.0)
HEMOGLOBIN: 15.4 g/dL (ref 13.0–17.0)
MCH: 31.9 pg (ref 26.0–34.0)
MCHC: 35.4 g/dL (ref 30.0–36.0)
MCV: 90.1 fL (ref 78.0–100.0)
PLATELETS: 347 10*3/uL (ref 150–400)
RBC: 4.83 MIL/uL (ref 4.22–5.81)
RDW: 13.3 % (ref 11.5–15.5)
WBC: 6.7 10*3/uL (ref 4.0–10.5)

## 2015-10-14 LAB — BASIC METABOLIC PANEL
ANION GAP: 14 (ref 5–15)
BUN: 8 mg/dL (ref 6–20)
CALCIUM: 10.5 mg/dL — AB (ref 8.9–10.3)
CO2: 23 mmol/L (ref 22–32)
CREATININE: 1.18 mg/dL (ref 0.61–1.24)
Chloride: 102 mmol/L (ref 101–111)
Glucose, Bld: 92 mg/dL (ref 65–99)
Potassium: 3.8 mmol/L (ref 3.5–5.1)
SODIUM: 139 mmol/L (ref 135–145)

## 2015-10-14 LAB — I-STAT TROPONIN, ED: TROPONIN I, POC: 0 ng/mL (ref 0.00–0.08)

## 2015-10-14 MED ORDER — ASPIRIN 81 MG PO CHEW
324.0000 mg | CHEWABLE_TABLET | Freq: Once | ORAL | Status: AC
  Administered 2015-10-14: 324 mg via ORAL
  Filled 2015-10-14: qty 4

## 2015-10-14 NOTE — ED Provider Notes (Signed)
CSN: [id number]     Arrival date & time 10/14/15  0806 History   First MD Initiated Contact with Patient 10/14/15 [phone]     Chief Complaint  Patient presents with  . Shortness of Breath  . Chest Pain     (Consider location/radiation/quality/duration/timing/severity/associated sxs/prior Treatment) HPI   Blood pressure 144/83, pulse 103, temperature 98.4 F (36.9 C), temperature source Oral, resp. rate 14, SpO2 100 %.  Brandon Glass is a 34 y.o. male complaining of palpitations, chest tightness and shortness of breath after patient smoked 2 g of crack and took [place]. This started several hours ago, and the symptoms have essentially resolved. Patient does have a primary care doctor, he smokes a pack a day since he was 53. No syncope, cough, fever.   Past Medical History  Diagnosis Date  . Depression   . Anxiety   . Substance abuse    History reviewed. No pertinent past surgical history. No family history on file. Social History  Substance Use Topics  . Smoking status: Current Every Day Smoker -- 1.00 packs/day    Types: Cigarettes  . Smokeless tobacco: None  . Alcohol Use: 3.0 oz/week    5 Cans of beer per week    Review of Systems  10 systems reviewed and found to be negative, except as noted in the HPI.  Allergies  Review of patient's allergies indicates no known allergies.  Home Medications   Prior to Admission medications   Medication Sig Start Date End Date Taking? Authorizing Provider  citalopram (CELEXA) 20 MG tablet Take 1 tablet (20 mg total) by mouth daily. 04/27/15  Yes Adonis Brook, NP  Multiple Vitamin (MULTIVITAMIN WITH MINERALS) TABS tablet Take 1 tablet by mouth daily.   Yes Historical Provider, MD  nicotine (NICODERM CQ - DOSED IN MG/24 HOURS) 21 mg/24hr patch Place 1 patch (21 mg total) onto the skin daily. 04/27/15  Yes Adonis Brook, NP  traZODone (DESYREL) 50 MG tablet Take 1 tablet (50 mg total) by mouth at bedtime as needed and may repeat dose  one time if needed for sleep. 04/27/15  Yes Adonis Brook, NP  ziprasidone (GEODON) 20 MG capsule Take 1 capsule (20 mg total) by mouth 2 (two) times daily with a meal. 04/27/15  Yes Adonis Brook, NP   BP 121/80 mmHg  Pulse 88  Temp(Src) 98.4 F (36.9 C) (Oral)  Resp 14  SpO2 100% Physical Exam  Constitutional: He is oriented to person, place, and time. He appears well-developed and well-nourished. No distress.  Multiple facial tattoos  HENT:  Head: Normocephalic.  Mouth/Throat: Oropharynx is clear and moist.  Eyes: Conjunctivae are normal.  Neck: Normal range of motion. No JVD present. No tracheal deviation present.  Cardiovascular: Normal rate, regular rhythm and intact distal pulses.   Radial pulse equal bilaterally  Pulmonary/Chest: Effort normal and breath sounds normal. No stridor. No respiratory distress. He has no wheezes. He has no rales. He exhibits no tenderness.  Abdominal: Soft. He exhibits no distension and no mass. There is no tenderness. There is no rebound and no guarding.  Musculoskeletal: Normal range of motion. He exhibits no edema or tenderness.  No calf asymmetry, superficial collaterals, palpable cords, edema, Homans sign negative bilaterally.    Neurological: He is alert and oriented to person, place, and time.  Skin: Skin is warm. He is not diaphoretic.  Psychiatric: He has a normal mood and affect.  Nursing note and vitals reviewed.   ED Course  Procedures (including critical  care time) Labs Review Labs Reviewed  BASIC METABOLIC PANEL - Abnormal; Notable for the following:    Calcium 10.5 (*)    All other components within normal limits  CBC  I-STAT TROPOININ, ED  Rosezena SensorI-STAT TROPOININ, ED    Imaging Review Dg Chest 2 View  10/14/2015  CLINICAL DATA:  Shortness of breath with cardiac palpitations EXAM: CHEST  2 VIEW COMPARISON:  None. FINDINGS: Lungs are clear. Heart size and pulmonary vascularity are normal. No adenopathy. No bone lesions.  IMPRESSION: No edema or consolidation. Electronically Signed   By: Bretta BangWilliam  Woodruff III M.D.   On: 10/14/2015 09:01   I have personally reviewed and evaluated these images and lab results as part of my medical decision-making.   EKG Interpretation   Date/Time:  Friday October 14 2015 08:18:18 EST Ventricular Rate:  99 PR Interval:  161 QRS Duration: 97 QT Interval:  321 QTC Calculation: 412 R Axis:   72 Text Interpretation:  Sinus rhythm Probable left atrial enlargement Left  ventricular hypertrophy ST elevation suggests acute pericarditis No  significant change since last tracing Confirmed by Freida BusmanALLEN  MD, ANTHONY  ([id number]) on 10/14/2015 9:12:29 AM      MDM   Final diagnoses:  Chest pain, unspecified chest pain type  Polysubstance dependence (HCC)    Filed Vitals:   10/14/15 0808 10/14/15 0815 10/14/15 0914 10/14/15 1000  BP:  144/83 144/84 121/80  Pulse:  103 101 88  Temp:  98.4 F (36.9 C)    TempSrc:  Oral    Resp:  14 15 14   SpO2: 98% 100% 98% 100%    Medications  aspirin chewable tablet 324 mg (324 mg Oral Given 10/14/15 0916)    Brandon Glass is 34 y.o. male presenting with presenting with palpitations and shortness of breath after he smoked a very large amount of crack and took MDMA. Symptoms resolved after presentation to the ED.  Reviewed EKG with attending, unchanged from prior. Chest x-ray negative, basic blood work including troponin is negative. I like to get a delta troponin however this patient refuses. He's been chest pain-free in the ED, can meaningfully discuss the risks and benefits to foregoing further testing. Counseled him on cessation of crack cocaine and ecstasy. Outpatient resource guide given.  Evaluation does not show pathology that would require ongoing emergent intervention or inpatient treatment. Pt is hemodynamically stable and mentating appropriately. Discussed findings and plan with patient/guardian, who agrees with care plan. All  questions answered. Return precautions discussed and outpatient follow up given.    Wynetta Emeryicole Rajanae Mantia, PA-C 10/14/15 1215  Lorre NickAnthony Allen, MD 10/17/15 2252

## 2015-10-14 NOTE — ED Notes (Signed)
Bed: [id number] Expected date:  Expected time:  Means of arrival:  Comments: Ems-molly

## 2015-10-14 NOTE — ED Notes (Signed)
Urinal given

## 2015-10-14 NOTE — Discharge Instructions (Signed)
Do not hesitate to return to the emergency room for any new, worsening or concerning symptoms. ° °Please obtain primary care using resource guide below. Let them know that you were seen in the emergency room and that they will need to obtain records for further outpatient management. ° ° ° °Emergency Department Resource Guide °1) Find a Doctor and Pay Out of Pocket °Although you won't have to find out who is covered by your insurance plan, it is a good idea to ask around and get recommendations. You will then need to call the office and see if the doctor you have chosen will accept you as a new patient and what types of options they offer for patients who are self-pay. Some doctors offer discounts or will set up payment plans for their patients who do not have insurance, but you will need to ask so you aren't surprised when you get to your appointment. ° °2) Contact Your Local Health Department °Not all health departments have doctors that can see patients for sick visits, but many do, so it is worth a call to see if yours does. If you don't know where your local health department is, you can check in your phone book. The CDC also has a tool to help you locate your state's health department, and many state websites also have listings of all of their local health departments. ° °3) Find a Walk-in Clinic °If your illness is not likely to be very severe or complicated, you may want to try a walk in clinic. These are popping up all over the country in pharmacies, drugstores, and shopping centers. They're usually staffed by nurse practitioners or physician assistants that have been trained to treat common illnesses and complaints. They're usually fairly quick and inexpensive. However, if you have serious medical issues or chronic medical problems, these are probably not your best option. ° °No Primary Care Doctor: °- Call [facility] at  832-8000 - they can help you locate a primary care doctor that  accepts your  insurance, provides certain services, etc. °- Physician Referral Service- 1-800-533-3463 ° °Chronic Pain Problems: °Organization         Address  Phone   Notes  °[facility]  (336) 297-2271 Patients need to be referred by their primary care doctor.  ° °Medication Assistance: °Organization         Address  Phone   Notes  °[place] [facility] [address] °[place], [place] [address] (336) 641-8030 --Must be a resident of [place] °-- Must have NO insurance coverage whatsoever (no Medicaid/ Medicare, etc.) °-- The pt. MUST have a primary care doctor that directs their care regularly and follows them in the community °  °[facility]  (866) 331-1348   °[employer]  (888) 892-1162   ° °Agencies that provide inexpensive medical care: °Organization         Address  Phone   Notes  °[facility]  (336) 832-8035   °[facility]    (336) 832-7272   °[facility] [address] °[place], [place] [address] (336) 832-4777   °[facility] [address], °[place] (336) 271-4999   °[facility]    (336) 373-0678   °[facility]    (336) 272-1050   °[facility] ° [address], [place] Phone:  (336) 832-4444, Fax:  (336) 832-4440 Hours of Operation:  9 am -   6 pm, M-F.  Also accepts Medicaid/Medicare and self-pay.  °[facility] ° [address], [place] Phone: (336) 832-3150, Fax: (336) [phone]. Hours of Operation:  8:30 am - 5:30 pm, M-F.  Also accepts Medicaid and self-pay.  °[facility] [place] [address], [place] Phone: (336) 878-6027   °[facility] [address], [place], [place] (336)723-1848, Ext. 123 Mondays & Thursdays: 7-9 AM.  First 15 patients are seen on a first come, first serve basis. °  ° °Medicaid-accepting [place] Providers: ° °Organization          Address  Phone   Notes  °[facility] [address], [place] (336) 641-2100 Also accepts self-pay patients.  °[facility] [address], [place] ° (336) 856-9996   °[facility] [address], [place] (336) 288-8857   °[facility] [address] [place] [address], [place] (336) 299-7000   °Veita Bland [address], [place]  ° (336) 373-1557 Only accepts [place] Access Medicaid patients after they have their name applied to their card.  ° °Self-Pay (no insurance) in [place]: ° °Organization         Address  Phone   Notes  °Sickle Cell Patients, [facility] [address], [place] (336) 832-1970   °[facility] [address], [place] (336) 832-4400   °[facility] ° [address], [place] (336) 992-4800   °[facility]/Dr. Osei-Bonsu ° [address] [place] [address], [place] or [address], [place] (336) 841-8500 Phone number for both [place] and [place] locations is the same.  °[facility] [address], [place] (336) 299-0000   °[facility] [address] [place] [address], [place] or [address] (336) 852-7530 °(336) 878-2260   °[facility] [address], [place] (336) 350-1642, phone; (336) 294-5005, fax Sees patients 1st and 3rd Saturday of every month.  Must not qualify for public or private insurance (i.e. Medicaid, Medicare, [place] Health Choice, Veterans' Benefits) • Household income should be no more than 200% of the poverty level •The clinic cannot treat you if you are pregnant or think you are pregnant • Sexually transmitted diseases are not treated at the clinic.  ° ° °Dental Care: °Organization         Address  Phone  Notes  °[facility] [address],  [place] (336) 641-6152 Accepts children up to age 21 who are enrolled in Medicaid or [place] Health Choice; pregnant women with a Medicaid card; and children who have applied for Medicaid or [place] Health Choice, but were declined, whose parents can pay a reduced fee at time of service.  °[facility] [place]  [address], [place] (336) 641-7733 Accepts children up to age 21 who are enrolled in Medicaid or  Health Choice; pregnant women with a Medicaid card; and children who have applied for Medicaid or  Health Choice, but were declined, whose parents can pay a reduced fee at time of service.  °[facility] ° [address], [place] (336) 641-4533 Patients are seen by appointment only. Walk-ins are not accepted. [facility] will see patients 18 years of age and   older. °Monday - Tuesday (8am-5pm) °Most Wednesdays (8:30-5pm) °$30 per visit, cash only  °[facility] ° [address], [place] (336) 641-4533 Patients are seen by appointment only. Walk-ins are not accepted. [facility] will see patients 18 years of age and older. °One Wednesday Evening (Monthly: Volunteer Based).  $30 per visit, cash only  °[facility]  (919) 537-3737 for adults; Children under age 4, call Graduate Pediatric Dentistry at ([phone]) [phone]. Children aged 4-14, please call (919) [phone] to request a pediatric application. ° Dental services are provided in all areas of dental care including fillings, crowns and bridges, complete and partial dentures, implants, gum treatment, root canals, and extractions. Preventive care is also provided. Treatment is provided to both adults and children. °Patients are selected via a lottery and there is often a waiting list. °  °[facility] [address], °[place] ° (336) 763-8833 [email or web address] °  °[facility] [address], [place], [place]  (336)723-1848, Ext. 123 Second and Fourth Thursday of each month, opens at 6:30 AM; Clinic ends at 9 AM.  Patients are seen on a first-come first-served basis, and a limited number are seen during each clinic.  ° °[facility] ° [address], [place], [place] (336) 723-7904   Eligibility Requirements °You must have lived in [place], or [place] counties for at least the last three months. °  You cannot be eligible for state or federal sponsored healthcare insurance, including Veterans Administration, Medicaid, or Medicare. °  You generally cannot be eligible for healthcare insurance through your employer.  °  How to apply: °Eligibility screenings are held every Tuesday and Wednesday afternoon from 1:00 pm until 4:00 pm. You do not need an appointment for the interview!  °[facility] [address], [place], [place] 336-631-2330   °[facility]  336-342-8273   °[facility]  336-703-3100   °[facility]  336-570-6415   ° °Behavioral Health Resources in the Community: °Intensive Outpatient Programs °Organization         Address  Phone  Notes  °[facility] [address], [place], [place] 336-878-6098   °[facility] [address], [place], [place] 336-832-9800   °[facility] [address], [place], [place] ° 336-882-2125   °[facility] [address],  °[place], [place] 1-800-853-5163 or 336-641-4981   °Substance Abuse Resources °Organization         Address  Phone  Notes  °[facility]  336-882-2125   °[facility]  336-784-9470   °[facility]  336-285-9073   °[facility]  336-845-3988   °Residential & Outpatient Substance Abuse Program  1-800-659-3381   °Psychological Services °Organization         Address  Phone  Notes  °[facility]  336- 832-9600   °[facility]  336- 378-7881    °[facility] [address], [place] 1-800-853-5163 or 336-641-4981   ° °Mobile Crisis Teams °Organization         Address  Phone  Notes  °[facility], Mobile Crisis Care Unit  1-877-626-1772   °Assertive °[facility] ° [address] [place], [place] 336-834-9664   °Sharon DeEsch [address] ° [place] 336-554-5454   ° °Self-Help/Support Groups °Organization         Address    Phone             Notes  °[facility] - variety of support groups  336- 373-1402 Call for more information  °Narcotics Anonymous (NA), [facility] [address], °[place]  2 meetings at this location  ° °Residential Treatment Programs °Organization         Address  Phone  Notes  °[facility] [address],    °[place]  1-866-801-8205   °[facility] ° [address], [place], [place] 704-293-8524   °[facility] [address], [place] 336-845-3988 Admissions: 8am-3pm M-F  °[facility] [address]    °[place], [place] 336-841-1104   °[facility] [address], [place], [place] 336-379-7146   °[facility] [address]  °[place], [place] 336-285-9073   °[facility] Intensive Outpatient [address], [place], [place] 336-852-3033   °[facility] [address]  °[place], [place] 1-877-615-2722 or 336-784-9470   °[facility] [address] [place], [place] 336-227-7417 Accepts Medicaid  °[facility] [address]  °[place] 1-800-659-3381 Substance Abuse/Addiction Treatment  ° °[place] Behavioral Health Resources °Organization         Address  Phone  Notes  °[facility]  (888) 581-9988   °Julie Brannon, PhD [address] [place], [place]   (336) 349-5553 or (336) 951-0000   °[facility]   [address] °[place], [place] (336) 349-4454   °[facility] [address], [place], [place] (336) 342-8316 Insurance/Medicaid/sponsorship through [facility]  °[facility] [address]                                    [place], [place] (336) 342-8316 Therapy/tele-psych/case  °[facility] [address]  ° [place], [place] (336) 349-2233    °Dr. Arfeen  (336) 349-4544   °[facility] [place]  [employer] [place] [facility] 1) [address], [place] °2) [address], [place] °3)  [address], [place] (336) 349-3220 °(336) 342-7768 ° °(336) 342-8140   °[place] [facility] (336) 342-1394 or (336) 342-3537 (After Hours)    ° ° ° °

## 2015-10-14 NOTE — ED Notes (Signed)
Per [facility]- Pt presents with NAD. Pt reports sudden onset of generalized chest tightness and shortness of breath resulting after smoking crack and molly. EKG in route ST only

## 2015-10-14 NOTE — ED Notes (Signed)
Pt doesn't want repeat I stat Troponin done.  RN aware.

## 2015-10-14 NOTE — ED Notes (Signed)
Went to discharge pt. Pt not in room. Pt may have left with IV in left arm.  Marylu LundJanet NT cleaned room. Unsure of pt's time of departure. At last assessment pt in NAD. EDP made aware. Leadership Servando SnareKaren R, Italyhad Q RN,  and Avon ProductsJakeema Charge RN made aware.

## 2016-05-19 ENCOUNTER — Emergency Department (HOSPITAL_COMMUNITY)
Admission: EM | Admit: 2016-05-19 | Discharge: 2016-05-20 | Disposition: A | Discharge status: Home / Self Care | Payer: Self-pay | Attending: Emergency Medicine | Admitting: Emergency Medicine

## 2016-05-19 ENCOUNTER — Encounter (HOSPITAL_COMMUNITY): Payer: Self-pay

## 2016-05-19 DIAGNOSIS — F1994 Other psychoactive substance use, unspecified with psychoactive substance-induced mood disorder: Secondary | ICD-10-CM | POA: Diagnosis present

## 2016-05-19 DIAGNOSIS — N179 Acute kidney failure, unspecified: Secondary | ICD-10-CM | POA: Insufficient documentation

## 2016-05-19 DIAGNOSIS — R4689 Other symptoms and signs involving appearance and behavior: Secondary | ICD-10-CM

## 2016-05-19 DIAGNOSIS — Z7289 Other problems related to lifestyle: Secondary | ICD-10-CM

## 2016-05-19 DIAGNOSIS — F1910 Other psychoactive substance abuse, uncomplicated: Secondary | ICD-10-CM | POA: Insufficient documentation

## 2016-05-19 DIAGNOSIS — F329 Major depressive disorder, single episode, unspecified: Secondary | ICD-10-CM | POA: Insufficient documentation

## 2016-05-19 DIAGNOSIS — R45851 Suicidal ideations: Secondary | ICD-10-CM

## 2016-05-19 DIAGNOSIS — F17210 Nicotine dependence, cigarettes, uncomplicated: Secondary | ICD-10-CM | POA: Insufficient documentation

## 2016-05-19 DIAGNOSIS — F1410 Cocaine abuse, uncomplicated: Secondary | ICD-10-CM | POA: Insufficient documentation

## 2016-05-19 DIAGNOSIS — F1210 Cannabis abuse, uncomplicated: Secondary | ICD-10-CM | POA: Insufficient documentation

## 2016-05-19 DIAGNOSIS — F1920 Other psychoactive substance dependence, uncomplicated: Secondary | ICD-10-CM | POA: Diagnosis present

## 2016-05-19 DIAGNOSIS — Z789 Other specified health status: Secondary | ICD-10-CM

## 2016-05-19 DIAGNOSIS — Z5181 Encounter for therapeutic drug level monitoring: Secondary | ICD-10-CM | POA: Insufficient documentation

## 2016-05-19 LAB — CBC WITH DIFFERENTIAL/PLATELET
Basophils Absolute: 0 10*3/uL (ref 0.0–0.1)
Basophils Relative: 0 %
EOS PCT: 1 %
Eosinophils Absolute: 0.1 10*3/uL (ref 0.0–0.7)
HCT: 43.7 % (ref 39.0–52.0)
Hemoglobin: 15.4 g/dL (ref 13.0–17.0)
LYMPHS ABS: 1.9 10*3/uL (ref 0.7–4.0)
LYMPHS PCT: 22 %
MCH: 31.6 pg (ref 26.0–34.0)
MCHC: 35.2 g/dL (ref 30.0–36.0)
MCV: 89.7 fL (ref 78.0–100.0)
MONOS PCT: 10 %
Monocytes Absolute: 0.9 10*3/uL (ref 0.1–1.0)
Neutro Abs: 6 10*3/uL (ref 1.7–7.7)
Neutrophils Relative %: 67 %
Platelets: 340 10*3/uL (ref 150–400)
RBC: 4.87 MIL/uL (ref 4.22–5.81)
RDW: 13.9 % (ref 11.5–15.5)
WBC: 9 10*3/uL (ref 4.0–10.5)

## 2016-05-19 LAB — COMPREHENSIVE METABOLIC PANEL
ALBUMIN: 5 g/dL (ref 3.5–5.0)
ALK PHOS: 51 U/L (ref 38–126)
ALT: 21 U/L (ref 17–63)
AST: 29 U/L (ref 15–41)
Anion gap: 9 (ref 5–15)
BUN: 6 mg/dL (ref 6–20)
CHLORIDE: 104 mmol/L (ref 101–111)
CO2: 26 mmol/L (ref 22–32)
CREATININE: 1.28 mg/dL — AB (ref 0.61–1.24)
Calcium: 9.5 mg/dL (ref 8.9–10.3)
GFR calc non Af Amer: >60 mL/min (ref >60)
GLUCOSE: 91 mg/dL (ref 65–99)
Potassium: 3.8 mmol/L (ref 3.5–5.1)
SODIUM: 139 mmol/L (ref 135–145)
Total Bilirubin: 1 mg/dL (ref 0.3–1.2)
Total Protein: 8.1 g/dL (ref 6.5–8.1)

## 2016-05-19 LAB — ACETAMINOPHEN LEVEL: Acetaminophen (Tylenol), Serum: <10 ug/mL — ABNORMAL LOW (ref 10–30)

## 2016-05-19 LAB — RAPID URINE DRUG SCREEN, HOSP PERFORMED
Amphetamines: POSITIVE — AB
BENZODIAZEPINES: NOT DETECTED
Barbiturates: NOT DETECTED
COCAINE: POSITIVE — AB
OPIATES: NOT DETECTED
TETRAHYDROCANNABINOL: POSITIVE — AB

## 2016-05-19 LAB — SALICYLATE LEVEL: Salicylate Lvl: <4 mg/dL (ref 2.8–30.0)

## 2016-05-19 LAB — ETHANOL: Alcohol, Ethyl (B): <5 mg/dL (ref <5)

## 2016-05-19 MED ORDER — ZIPRASIDONE HCL 20 MG PO CAPS
20.0000 mg | ORAL_CAPSULE | Freq: Two times a day (BID) | ORAL | Status: DC
  Administered 2016-05-19 – 2016-05-20 (×2): 20 mg via ORAL
  Filled 2016-05-19 (×2): qty 1

## 2016-05-19 MED ORDER — ONDANSETRON HCL 4 MG PO TABS: 4.0000 mg | ORAL_TABLET | Freq: Three times a day (TID) | ORAL | Status: DC | PRN

## 2016-05-19 MED ORDER — LORAZEPAM 1 MG PO TABS
1.0000 mg | ORAL_TABLET | Freq: Three times a day (TID) | ORAL | Status: DC | PRN
Start: 2016-05-19 — End: 2016-05-20
  Administered 2016-05-19: 1 mg via ORAL
  Filled 2016-05-19: qty 1

## 2016-05-19 MED ORDER — ACETAMINOPHEN 325 MG PO TABS: 650.0000 mg | ORAL_TABLET | ORAL | Status: DC | PRN

## 2016-05-19 MED ORDER — ALUM & MAG HYDROXIDE-SIMETH 200-200-20 MG/5ML PO SUSP: 30.0000 mL | ORAL | Status: DC | PRN

## 2016-05-19 MED ORDER — TRAZODONE HCL 50 MG PO TABS: 50.0000 mg | ORAL_TABLET | Freq: Every evening | ORAL | Status: DC | PRN

## 2016-05-19 MED ORDER — IBUPROFEN 200 MG PO TABS: 600.0000 mg | ORAL_TABLET | Freq: Three times a day (TID) | ORAL | Status: DC | PRN

## 2016-05-19 MED ORDER — NICOTINE 21 MG/24HR TD PT24
21.0000 mg | MEDICATED_PATCH | Freq: Every day | TRANSDERMAL | Status: DC
  Administered 2016-05-19 – 2016-05-20 (×2): 21 mg via TRANSDERMAL
  Filled 2016-05-19 (×2): qty 1

## 2016-05-19 MED ORDER — ZOLPIDEM TARTRATE 5 MG PO TABS: 5.0000 mg | ORAL_TABLET | Freq: Every evening | ORAL | Status: DC | PRN

## 2016-05-19 NOTE — ED Notes (Signed)
Patient noted sleeping in room. No complaints, stable, in no acute distress. Q15 minute rounds and monitoring via Security Cam[employer] to continue.  

## 2016-05-19 NOTE — Discharge Instructions (Signed)
Use the list below to find a substance abuse treatment center. STOP USING DRUGS AND DRINKING ALCOHOL! Follow up with the detox facilities for ongoing management of your substance abuse.    Polysubstance Abuse When people abuse more than one drug or type of drug it is called polysubstance or polydrug abuse. For example, many smokers also drink alcohol. This is one form of polydrug abuse. Polydrug abuse also refers to the use of a drug to counteract an unpleasant effect produced by another drug. It may also be used to help with withdrawal from another drug. People who take stimulants may become agitated. Sometimes this agitation is countered with a tranquilizer. This helps protect against the unpleasant side effects. Polydrug abuse also refers to the use of different drugs at the same time.  Anytime drug use is interfering with normal living activities, it has become abuse. This includes problems with family and friends. Psychological dependence has developed when your mind tells you that the drug is needed. This is usually followed by physical dependence which has developed when continuing increases of drug are required to get the same feeling or "high". This is known as addiction or chemical dependency. A person's risk is much higher if there is a history of chemical dependency in the family. SIGNS OF CHEMICAL DEPENDENCY  You have been told by friends or family that drugs have become a problem.  You fight when using drugs.  You are having blackouts (not remembering what you do while using).  You feel sick from using drugs but continue using.  You lie about use or amounts of drugs (chemicals) used.  You need chemicals to get you going.  You are suffering in work performance or in school because of drug use.  You get sick from use of drugs but continue to use anyway.  You need drugs to relate to people or feel comfortable in social situations.  You use drugs to forget problems. "Yes"  answered to any of the above signs of chemical dependency indicates there are problems. The longer the use of drugs continues, the greater the problems will become. If there is a family history of drug or alcohol use, it is best not to experiment with these drugs. Continual use leads to tolerance. After tolerance develops more of the drug is needed to get the same feeling. This is followed by addiction. With addiction, drugs become the most important part of life. It becomes more important to take drugs than participate in the other usual activities of life. This includes relating to friends and family. Addiction is followed by dependency. Dependency is a condition where drugs are now needed not just to get high, but to feel normal. Addiction cannot be cured but it can be stopped. This often requires outside help and the care of professionals. Treatment centers are listed in the yellow pages under: Cocaine, Narcotics, and Alcoholics Anonymous. Most hospitals and clinics can refer you to a specialized care center. Talk to your caregiver if you need help.   This information is not intended to replace advice given to you by your health care provider. Make sure you discuss any questions you have with your health care provider.   Document Released: 06/06/2005 Document Revised: 01/07/2012 Document Reviewed: 10/20/2014 Elsevier Interactive Patient Education 2016 [employer].  State  Corporation Guide Outpatient Counseling/Substance Abuse Adult The United Ways 211 is a great source of information about community services available.  Access by dialing 2-1-1 from anywhere in [place], or by website -  PooledIncome.pl.   Other Local Resources (Updated 10/2015)  Crisis Hotlines   Services     Area Served  [employer]  Crisis Hotline, available 24 hours a day, 7 days a week: 325-398-4342 [facility], [place]   [facility]  Crisis Hotline, available 24 hours a day,  7 days a week: 817-549-1456 [facility], [place]  [facility]  Suicide Prevention Hotline, available 24 hours a day, 7 days a week: 332-235-2370 [facility], [place]  [employer], available 24 hours a day, 7 days a week: 3077711132 [facility], [place]   [facility] Access to [employer], available 24 hours a day, 7 days a week: 218-518-1297 All   [facility]  Crisis Hotline, available 24 hours a day, 7 days a week: (410)581-3026 All   Other Local Resources (Updated 10/2015)  Outpatient Counseling/ Substance Abuse Programs  Services     Address and Phone Number  ADS (Alcohol and Drug Services)   Options include Individual counseling, group counseling, intensive outpatient program (several hours a day, several days a week)  Offers depression assessments  Provides methadone maintenance program [phone] [address] [place], [place] [address]   [facility]   Offers partial hospitalization/day treatment and DUI/DWI programs  [employer], private insurance [phone] [address] [place], [place] [address]  [facility]    Services include intensive outpatient program (several hours a day, several days a week), outpatient treatment, DUI/DWI services, family education  Also has some services specifically for [employer] transitional housing  [phone] [address] [place], [place] [address]     [facility]  [employer], private pay, and private insurance [phone] [address], [address] [place], [place] [address]  [employer] [facility]  Services include individual counseling, substance abuse intensive outpatient program (several hours a day, several days a week), day treatment  Delene Loll, Medicaid, private insurance [phone] [address], [address] [place], [place] [address]  [facility]   Offers substance abuse intensive outpatient program (several hours a day, several days a week), partial hospitalization program [phone] [address] [place], [place] [address]  [phone] [address] [place], [place] [address]  [phone] [address] [place], [place] [address]  [phone], [address] [place], [place] [address]  [facility]  Individual counseling only  Accepts private insurance only [phone] [address], [address] [place], [place] [address]  [facility]  Methadone maintenance program [phone] [address] [place], [place] [address]  [facility]  Walk-In Clinic providing substance abuse and mental health counseling  Accepts Medicaid, Medicare, private insurance  Offers sliding scale for uninsured [phone] [address] [place], [place]   [facility] [place], [employer].  Offers individual counseling, and intensive in-home services [phone] [address], [address] [place], [place] [address]  [facility] individual counseling, family counseling, group therapy, domestic violence counseling, consumer credit counseling  Accepts Medicare, Medicaid, private insurance  Offers sliding scale for uninsured [phone] [address] [place], [place] [address]  [phone] [facility], [address] [place], [place] [address]  [facility]  Offers individual, family and group counseling  3 locations - [place], [place], and [place]  [phone]  [address] [place], [place] [address]  [address] [place], [place] [address]  [address] [place], [place] [address]  [facility]    Offers psychiatric assessment, 8-week Intensive Outpatient Program (several hours a day, several times a week, daytime or evenings), early recovery group, family Program, medication management  Private pay or private  insurance only [phone], or  [phone] [address] [place], [place] [address]  [place] [employer] individual, couples and family counseling  Accepts Medicaid, private insurance, and sliding scale for uninsured [phone] [address] [place], [place] [address]  Len Blalock, MD  Individual counseling  Private insurance [phone] [address] [place], [place] [address]  [facility]   Offers assessment, substance abuse treatment, and behavioral health treatment [phone] [address] [place], [place] [address]  [facility]  Individual counseling  Accepts private insurance [phone] [address] [place], [place] [address]  [facility]  Individual counseling  Delene Loll, private insurance [phone] [address] [place], [place] [address]  [facility]    Offers intensive outpatient program (several hours a day, several times a week)  Private pay, private insurance [phone] [facility] [place], [place]  [facility]  Individual counseling  Medicare, private insurance [phone] [address] [place], [place] [address]  [facility]    Offers intensive outpatient program (several hours a day, several times a week) and partial hospitalization program [phone] [address] [place], [place] [address]  Emerson Monte, MD  Individual counseling [phone] [address] [place], [place] [address]  [facility]  Offers Christian counseling to individuals, couples, and families  Accepts Medicare and private insurance; offers sliding scale for uninsured [phone] [address] [place], [place] [address]  [facility]  [facility] [phone] [address] [place], [place] [address]  [facility]  [facility] crisis counseling, individual counseling, group therapy, in-home therapy, domestic violence services, day treatment, DWI services, Administrator, arts (CST), Assertive Community Treatment Team (ACTT), substance abuse Intensive Outpatient Program (several hours a day, several times a week)  2 locations - [place] and [place] [phone] [address] [place], [place] [address]  [phone] [address] [place], [place] [address]  [facility]     Individual counseling and group therapy  Accepts private insurance, [place], [place] [phone] [address] [place], [place]  [facility]  Offers individual and family counseling  Offers LGBTQ services  Accepts private insurance and private pay [phone] [address] [place], [place] [address]  [facility]    Offers individual counseling, group therapy, and outpatient detox  Accepts private insurance [phone] [address] [place], [place]  [facility]  Individual counseling  Accepts Medicare, private insurance [phone] [address] [place], [place] [address]  [employer]  Individual counseling  Accepts Medicare, private insurance [phone] [address] [place], [place] [address]  [facility]   Offers substance abuse Intensive Outpatient Program (several hours a day, several times a week) [phone], or [phone] [place], [place]
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
## 2016-05-19 NOTE — ED Notes (Signed)
Patient is requesting detox from cocaine, marijuana and alcohol. Patient denies SI/Hi, visual or auditory hallucinations. Patient states he used cocaine, marijuana, and 40 ounce beer at 0400 today.

## 2016-05-19 NOTE — ED Notes (Signed)
Patient changed in scrubs and wanded by security

## 2016-05-19 NOTE — ED Notes (Signed)
Report from Karen RN. Patient sleeping, respirations regular and unlabored. Q15 minute rounds and security camera observation to continue.   

## 2016-05-19 NOTE — Progress Notes (Signed)
Pt reports drinking "sometimes two or more fifths of Hennessey per day." PRN 1mg  Ativan given with scheduled Geodon and nicotine patch. He reports feelings of body aches, general unease, and nausea. He requested something to eat and was given crackers/peanut butter and water. He reports last drinking at about 4 a.m. No reported hx of withdrawal-related seizures. Pt admits SI (contracts for safety), denies HI and current AVH. (He says he sometimes has AVH when withdrawing). Pt is safe at this time. Will continue to monitor for needs/safety.
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
## 2016-05-20 ENCOUNTER — Observation Stay (HOSPITAL_COMMUNITY)
Admission: AD | Admit: 2016-05-20 | Discharge: 2016-05-21 | Disposition: A | Discharge status: Home / Self Care | Payer: Federal, State, Local not specified - Other | Source: Intra-hospital | Attending: Psychiatry | Admitting: Psychiatry

## 2016-05-20 ENCOUNTER — Encounter (HOSPITAL_COMMUNITY): Payer: Self-pay | Admitting: *Deleted

## 2016-05-20 DIAGNOSIS — F1423 Cocaine dependence with withdrawal: Secondary | ICD-10-CM | POA: Insufficient documentation

## 2016-05-20 DIAGNOSIS — F314 Bipolar disorder, current episode depressed, severe, without psychotic features: Principal | ICD-10-CM | POA: Insufficient documentation

## 2016-05-20 DIAGNOSIS — F1994 Other psychoactive substance use, unspecified with psychoactive substance-induced mood disorder: Secondary | ICD-10-CM | POA: Diagnosis present

## 2016-05-20 DIAGNOSIS — R45851 Suicidal ideations: Secondary | ICD-10-CM | POA: Insufficient documentation

## 2016-05-20 DIAGNOSIS — F1920 Other psychoactive substance dependence, uncomplicated: Secondary | ICD-10-CM | POA: Insufficient documentation

## 2016-05-20 DIAGNOSIS — F17210 Nicotine dependence, cigarettes, uncomplicated: Secondary | ICD-10-CM | POA: Insufficient documentation

## 2016-05-20 DIAGNOSIS — F1290 Cannabis use, unspecified, uncomplicated: Secondary | ICD-10-CM | POA: Insufficient documentation

## 2016-05-20 DIAGNOSIS — F4310 Post-traumatic stress disorder, unspecified: Secondary | ICD-10-CM | POA: Insufficient documentation

## 2016-05-20 DIAGNOSIS — F1020 Alcohol dependence, uncomplicated: Secondary | ICD-10-CM | POA: Insufficient documentation

## 2016-05-20 DIAGNOSIS — Z590 Homelessness: Secondary | ICD-10-CM | POA: Insufficient documentation

## 2016-05-20 MED ORDER — TRAZODONE HCL 100 MG PO TABS: 100.0000 mg | ORAL_TABLET | Freq: Every day | ORAL | Status: DC

## 2016-05-20 MED ORDER — LORAZEPAM 1 MG PO TABS
1.0000 mg | ORAL_TABLET | Freq: Three times a day (TID) | ORAL | Status: DC | PRN
Start: 2016-05-20 — End: 2016-05-21

## 2016-05-20 MED ORDER — ZIPRASIDONE HCL 20 MG PO CAPS: 40.0000 mg | ORAL_CAPSULE | Freq: Every day | ORAL | Status: DC

## 2016-05-20 MED ORDER — ACETAMINOPHEN 325 MG PO TABS
650.0000 mg | ORAL_TABLET | ORAL | Status: DC | PRN
Start: 2016-05-20 — End: 2016-05-21

## 2016-05-20 MED ORDER — IBUPROFEN 600 MG PO TABS: 600.0000 mg | ORAL_TABLET | Freq: Three times a day (TID) | ORAL | Status: DC | PRN

## 2016-05-20 MED ORDER — TRAZODONE HCL 100 MG PO TABS
100.0000 mg | ORAL_TABLET | Freq: Every day | ORAL | Status: DC
  Administered 2016-05-20: 100 mg via ORAL
  Filled 2016-05-20: qty 7
  Filled 2016-05-20: qty 1

## 2016-05-20 MED ORDER — ONDANSETRON HCL 4 MG PO TABS: 4.0000 mg | ORAL_TABLET | Freq: Three times a day (TID) | ORAL | Status: DC | PRN

## 2016-05-20 MED ORDER — ZIPRASIDONE HCL 40 MG PO CAPS
40.0000 mg | ORAL_CAPSULE | Freq: Every day | ORAL | Status: DC
  Administered 2016-05-20: 40 mg via ORAL
  Filled 2016-05-20: qty 7
  Filled 2016-05-20: qty 1

## 2016-05-20 NOTE — ED Notes (Signed)
Patient noted sleeping in room. No complaints, stable, in no acute distress. Q15 minute rounds and monitoring via Security Cam[employer] to continue.  
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
## 2016-05-20 NOTE — Progress Notes (Signed)
Disposition:  Patient was accepted to [facility] OBS-1 and can be transferred at 1:30pm.  This writer informed Carolyn,RN of the updated disposition.      Brandon Glass, MSW, [facility] Triage Specialist [phone]

## 2016-05-20 NOTE — Progress Notes (Signed)
Patient is silly, depressed, and sad. He stated that he is going through financial difficulties and he really did not want to talk. He stated that he does not have any SI, HI, and AVH. He stated that he has no where to go. He endorses alcohol consumption, THC use, cocaine, and MDMA use.   Patient remains safe through constant monitoring with exception of bathroom times, education, support, and encouragement. Verbal contract for safety  Patient is receptive and calm. Will continue to monitor.

## 2016-05-20 NOTE — ED Notes (Signed)
Patient transferred to OBS #3 via [facility] transportation.

## 2016-05-20 NOTE — ED Notes (Addendum)
Called Joy in OBS to give report.  Report given. Advised to send patient at 1330.

## 2016-05-21 MED ORDER — NICOTINE 21 MG/24HR TD PT24: 21.0000 mg | MEDICATED_PATCH | Freq: Every day | TRANSDERMAL | 0 refills | Status: DC

## 2016-05-21 MED ORDER — ZIPRASIDONE HCL 40 MG PO CAPS: 40.0000 mg | ORAL_CAPSULE | Freq: Every day | ORAL | 0 refills | Status: DC

## 2016-05-21 MED ORDER — TRAZODONE HCL 100 MG PO TABS: 100.0000 mg | ORAL_TABLET | Freq: Every day | ORAL | 0 refills | Status: DC

## 2016-05-21 NOTE — Progress Notes (Signed)
D: Patient calm and cooperative;  Affect flat; mood depressed.  He denies suicidal and homicidal ideation and AVH.  No self-injurious behaviors noted or reported. A:  Medications given as ordered; emotional support provided; encouraged him to seek assistance with needs/concerns. R:  Safety maintained on unit. 

## 2016-05-21 NOTE — Discharge Summary (Signed)
Brandon Glass  Patient:  Brandon Glass is an 35 y.o., male MRN:  [id number] DOB:  26-Jan-1981 Patient phone:  [phone] (home)  Patient address:   [address] [place] [address],  Total Time spent with patient: 30 minutes  Date of Admission:  05/20/2016 Date of Discharge: 04/27/2015  Reason for Admission:  Suicidal ideation  Principal Problem: <principal problem not specified> Discharge Diagnoses: Patient Active Problem List   Diagnosis Date Noted  . Severe recurrent major depression without psychotic features ([place]) [F33.2] 04/25/2015  . PTSD (post-traumatic stress disorder) [F43.10] 04/25/2015  . Substance abuse [F19.10] 04/24/2015  . Polysubstance dependence ([place]) [F19.20]   . Alcohol use disorder, moderate, dependence ([place]) [F10.20]   . Cocaine use disorder, moderate, dependence ([place]) [F14.20]   . Polysubstance dependence, non-opioid, continuous ([place]) [F19.20] 02/20/2013    Class: Chronic  . Alcohol dependence ([place]) [F10.20] 02/20/2013  . Cocaine dependence ([place]) [F14.20] 02/20/2013  . Substance induced mood disorder [facility]) [F19.94] 02/20/2013    Musculoskeletal: Strength & Muscle Tone: within normal limits Gait & Station: normal Patient leans: N/A  Physical Exam  Vitals reviewed.   ROS  Blood pressure 112/77, pulse 91, temperature 97.8 F (36.6 C), temperature source Oral, resp. rate 16, height _0  (1.93 m), weight 96.2 kg (212 lb), SpO2 98 %.Body mass index is 25.81 kg/m.     Has this patient used any form of tobacco in the last 30 days? (Cigarettes, Smokeless Tobacco, Cigars, and/or Pipes) Yes, A prescription for an FDA-approved tobacco cessation medication was offered at discharge and the patient refused samples and Rx given  Past Medical History:  Past Medical History:  Diagnosis Date  . Anxiety   . Depression   . Substance abuse     Past Surgical History:  Procedure Laterality Date  . arm surgery    . LEG  SURGERY     Family History: History reviewed. No pertinent family history. Social History:  History  Alcohol Use  . Yes    Comment: drinks daily "all day"     History  Drug Use  . Types: Cocaine, Marijuana, MDMA (Ecstacy)    Comment: daily use    Social History   Social History  . Marital status: Single    Spouse name: N/A  . Number of children: N/A  . Years of education: N/A   Social History Main Topics  . Smoking status: Current Every Day Smoker    Packs/day: 1.00    Types: Cigarettes  . Smokeless tobacco: Never Used  . Alcohol use Yes     Comment: drinks daily "all day"  . Drug use:     Types: Cocaine, Marijuana, MDMA (Ecstacy)     Comment: daily use  . Sexual activity: Yes    Partners: Female   Other Topics Concern  . None   Social History Narrative  . None   Risk to Self: Is patient at risk for suicide?: Yes Risk to Others:   Prior Inpatient Therapy:   Yes [facility] 2016  Prior Outpatient Therapy:  Yes  Level of Care:  OP  Hospital Course:    Per initial assessment at [facility]:   Brandon Glass is an 35 y.o. male. Pt comes to [facility] with father requesting detox.  Pt was seen by PA and given referrals for substance abuse programs.  When [facility] attempted to discharge pt, he and his father reported pt was suicidal.  Pt was then medically cleared and evaluated by  TTS.  Pt reports long term substance abuse issues.  Pt reports significant current problems: addiction, homelessness, he is not allowed to see his son.  Pt reports he called his father today from the hotel where he was staying and asked for help.  Pt reports SI but is vague about a plan.  Father reports several situations within the past two weeks where they have been concerned about suicide including one two weeks ago when pt was on the phone with parents and fired a gun and they thought he had shot himself.  Pt reports he tried to shoot himself another time within the past month but the "gun jammed."  Pt also  reports HI four days ago when he tried to hurt a friend of his who was giving him "tough love" about his substance use issues.  Pt reports he sometimes hears voices when high on drugs, but does not appear psychotic at this time and denies any psychosis during assessment.   Pt reports long term use of alcohol, cocaine, marijuana, and more recent daily use of meth amphetamine (past 2 months)  Pt does report withdrawal symptoms, but  BAC <5.  Pt has had treatment on several occasions within the past 3 years including two detox admits to [facility].    Upon completion of this observation admission the patient was both mentally and medically stable for discharge denying suicidal/homicidal ideation, auditory/visual/tactile hallucinations, delusional thoughts and paranoia. Patient stated "I feel better. I do not want to hurt myself. My father will let me stay with him. I am willing to follow up on the referrals. I am tired of seeing my mother crying over my drug use. I am not hearing voices or feeling paranoid. I feel stable to discharge today and I have a place to go." The patient was provided with prescriptions and a sample of his medications.    Consults:  None  Significant Diagnostic Studies:  labs: per ED  Discharge Vitals:   Blood pressure 112/77, pulse 91, temperature 97.8 F (36.6 C), temperature source Oral, resp. rate 16, height _0  (1.93 m), weight 96.2 kg (212 lb), SpO2 98 %. Body mass index is 25.81 kg/m. Lab Results:   Results for orders placed or performed during the hospital encounter of 05/19/16 (from the past 72 hour(s))  Comprehensive metabolic panel     Status: Abnormal   Collection Time: 05/19/16  3:38 PM  Result Value Ref Range   Sodium 139 135 - 145 mmol/L   Potassium 3.8 3.5 - 5.1 mmol/L   Chloride 104 101 - 111 mmol/L   CO2 26 22 - 32 mmol/L   Glucose, Bld 91 65 - 99 mg/dL   BUN 6 6 - 20 mg/dL   Creatinine, Ser 1.28 (H) 0.61 - 1.24 mg/dL   Calcium 9.5 8.9 - 10.3 mg/dL   Total  Protein 8.1 6.5 - 8.1 g/dL   Albumin 5.0 3.5 - 5.0 g/dL   AST 29 15 - 41 U/L   ALT 21 17 - 63 U/L   Alkaline Phosphatase 51 38 - 126 U/L   Total Bilirubin 1.0 0.3 - 1.2 mg/dL   GFR calc non Af Amer >60 >60 mL/min   GFR calc Af Amer >60 >60 mL/min    Comment: (Glass) The eGFR has been calculated using the CKD EPI equation. This calculation has not been validated in all clinical situations. eGFR's persistently <60 mL/min signify possible Chronic Kidney Disease.    Anion gap 9 5 - 15  Ethanol     Status: None   Collection Time: 05/19/16  3:38 PM  Result Value Ref Range   Alcohol, Ethyl (B) <5 <5 mg/dL    Comment:        LOWEST DETECTABLE LIMIT FOR SERUM ALCOHOL IS 5 mg/dL FOR MEDICAL PURPOSES ONLY   CBC with Diff     Status: None   Collection Time: 05/19/16  3:38 PM  Result Value Ref Range   WBC 9.0 4.0 - 10.5 K/uL   RBC 4.87 4.22 - 5.81 MIL/uL   Hemoglobin 15.4 13.0 - 17.0 g/dL   HCT 43.7 39.0 - 52.0 %   MCV 89.7 78.0 - 100.0 fL   MCH 31.6 26.0 - 34.0 pg   MCHC 35.2 30.0 - 36.0 g/dL   RDW 13.9 11.5 - 15.5 %   Platelets 340 150 - 400 K/uL   Neutrophils Relative % 67 %   Neutro Abs 6.0 1.7 - 7.7 K/uL   Lymphocytes Relative 22 %   Lymphs Abs 1.9 0.7 - 4.0 K/uL   Monocytes Relative 10 %   Monocytes Absolute 0.9 0.1 - 1.0 K/uL   Eosinophils Relative 1 %   Eosinophils Absolute 0.1 0.0 - 0.7 K/uL   Basophils Relative 0 %   Basophils Absolute 0.0 0.0 - 0.1 K/uL  Salicylate level     Status: None   Collection Time: 05/19/16  3:38 PM  Result Value Ref Range   Salicylate Lvl <4.6 2.8 - 30.0 mg/dL  Acetaminophen level     Status: Abnormal   Collection Time: 05/19/16  3:38 PM  Result Value Ref Range   Acetaminophen (Tylenol), Serum <10 (L) 10 - 30 ug/mL    Comment:        THERAPEUTIC CONCENTRATIONS VARY SIGNIFICANTLY. A RANGE OF 10-30 ug/mL MAY BE AN EFFECTIVE CONCENTRATION FOR MANY PATIENTS. HOWEVER, SOME ARE BEST TREATED AT CONCENTRATIONS OUTSIDE  THIS RANGE. ACETAMINOPHEN CONCENTRATIONS >150 ug/mL AT 4 HOURS AFTER INGESTION AND >50 ug/mL AT 12 HOURS AFTER INGESTION ARE OFTEN ASSOCIATED WITH TOXIC REACTIONS.   Urine rapid drug screen (hosp performed)not at [facility]     Status: Abnormal   Collection Time: 05/19/16  4:30 PM  Result Value Ref Range   Opiates NONE DETECTED NONE DETECTED   Cocaine POSITIVE (A) NONE DETECTED   Benzodiazepines NONE DETECTED NONE DETECTED   Amphetamines POSITIVE (A) NONE DETECTED   Tetrahydrocannabinol POSITIVE (A) NONE DETECTED   Barbiturates NONE DETECTED NONE DETECTED    Comment:        DRUG SCREEN FOR MEDICAL PURPOSES ONLY.  IF CONFIRMATION IS NEEDED FOR ANY PURPOSE, NOTIFY LAB WITHIN 5 DAYS.        LOWEST DETECTABLE LIMITS FOR URINE DRUG SCREEN Drug Class       Cutoff (ng/mL) Amphetamine      1000 Barbiturate      200 Benzodiazepine   803 Tricyclics       212 Opiates          300 Cocaine          300 THC              50     Physical Findings: AIMS: Facial and Oral Movements Muscles of Facial Expression: None, normal Lips and Perioral Area: None, normal Jaw: None, normal Tongue: None, normal,Extremity Movements Upper (arms, wrists, hands, fingers): None, normal Lower (legs, knees, ankles, toes): None, normal, Trunk Movements Neck, shoulders, hips: None, normal, Overall Severity Severity of abnormal movements (highest score from questions above):  None, normal Incapacitation due to abnormal movements: None, normal Patient's awareness of abnormal movements (rate only patient's report): No Awareness, Dental Status Current problems with teeth and/or dentures?: No Does patient usually wear dentures?: No  CIWA:    COWS:  COWS Total Score: 3   Discharge destination:  Home  Is patient on multiple antipsychotic therapies at discharge:  No   Has Patient had three or more failed trials of antipsychotic monotherapy by history:  No    Recommended Plan for Multiple Antipsychotic  Therapies: NA  Discharge Instructions    Diet - low sodium heart healthy    Complete by:  As directed   Increase activity slowly    Complete by:  As directed       Medication List    STOP taking these medications   citalopram 20 MG tablet Commonly known as:  CELEXA     TAKE these medications     Indication  nicotine 21 mg/24hr patch Commonly known as:  NICODERM CQ - dosed in mg/24 hours Place 1 patch (21 mg total) onto the skin daily.  Indication:  Nicotine Addiction   traZODone 100 MG tablet Commonly known as:  DESYREL Take 1 tablet (100 mg total) by mouth at bedtime. What changed:  medication strength  how much to take  when to take this  reasons to take this  Indication:  Aggressive Behavior, Cocaine Withdrawal, Trouble Sleeping   ziprasidone 40 MG capsule Commonly known as:  GEODON Take 1 capsule (40 mg total) by mouth daily after supper. What changed:  medication strength  how much to take  when to take this  Indication:  Manic-Depression      Follow-up [place]. Go in 7 day(s).   Why:  You have an appointment at [facility] in [place], [place] on Monday May 28, 2016 at 12:00 noon.  Phone Number: [phone] Contact information: [address] [place] [address] [phone]           Follow-up recommendations:  Activity:  as tol, diet as tol  Comments:  1.  Take all your medications as prescribed.              2.  Report any adverse side effects to outpatient provider.                       3.  Patient instructed to not use alcohol or illegal drugs while on prescription medicines.            4.  In the event of worsening symptoms, instructed patient to call 911, the crisis hotline or go to nearest emergency room for evaluation of symptoms.  Total Discharge Time: 30 min  Signed: Elmarie Shiley, NP-C 05/21/2016, 4:17 PM

## 2016-05-21 NOTE — Progress Notes (Signed)
Written/verbal discharge instructions given to patient with verbalization of understanding; all belongings returned to patient. He denies suicidal and homicidal ideation and AVH; Reports no other concerns; sample medications, prescriptions and follow-up appointment given; discharged home with father in stable condition; ambulatory upon discharge.

## 2016-05-21 NOTE — Progress Notes (Signed)
D: Patient met sleeping at the time of shift change. Staff woke patient up for meds and 1:1 assessment. Patient denies pain, SI, AH/VH at this time. Patient stated "I feel better". Patient made no further complaint. Ate snakes and went back to sleep.  A: Staff offered support and encouragement as needed. Due meds/prn given as ordered.  Maintained safety by constant observation.  R: patient remain safe.

## 2016-05-21 NOTE — H&P (Signed)
Psychiatric [facility]-Observation Unit Assessment Adult  Patient Identification: Brandon Glass MRN:  [id number] Date of Evaluation:  05/21/2016 Chief Complaint:  Patient states "I think I was coming down off the drugs because I feel fine now."  Principal Diagnosis: Substance induced mood disorder ([place]) Diagnosis:   Patient Active Problem List   Diagnosis Date Noted  . Severe recurrent major depression without psychotic features ([place]) [F33.2] 04/25/2015  . PTSD (post-traumatic stress disorder) [F43.10] 04/25/2015  . Substance abuse [F19.10] 04/24/2015  . Polysubstance dependence ([place]) [F19.20]   . Alcohol use disorder, moderate, dependence ([place]) [F10.20]   . Cocaine use disorder, moderate, dependence ([place]) [F14.20]   . Polysubstance dependence, non-opioid, continuous ([place]) [F19.20] 02/20/2013    Class: Chronic  . Alcohol dependence ([place]) [F10.20] 02/20/2013  . Cocaine dependence ([place]) [F14.20] 02/20/2013  . Substance induced mood disorder [facility]) [F19.94] 02/20/2013   History of Present Illness:  Per initial assessment at [facility]:   Brandon Glass is an 35 y.o. male who presented to [facility] with father requesting detox.  Pt was seen by PA and given referrals for substance abuse programs.  When [facility] attempted to discharge pt according to notes, he and his father reported pt was suicidal.  Pt was then medically cleared and evaluated by TTS.  Pt reports long term substance abuse issues.  Pt reports significant current problems: addiction, homelessness, he is not allowed to see his son.  Pt reports he called his father today from the hotel where he was staying and asked for help.  Pt reports SI but is vague about a plan. His  father reports several situations within the past two weeks where they have been concerned about suicide including one two weeks ago when pt was on the phone with parents and fired a gun and they thought he had shot himself.  Pt reports he tried to shoot himself another time within  the past month but the "gun jammed."  Pt also reports HI four days ago when he tried to hurt a friend of his who was giving him "tough love" about his substance use issues.  Pt reports he sometimes hears voices when high on drugs, but does not appear psychotic at this time.  Pt reports long term use of alcohol, cocaine, marijuana, and more recent daily use of meth amphetamine (past 2 months)  Pt does report withdrawal symptoms, but they seem mild currently.  BAC <5.  Pt has had treatment on several occasions within the past 3 years including two detox admits to [facility].  Pt appeared motivated to follow up on outpatient resources including residential treatment at [facility]. He reported receiving treatment at [facility] one year ago for polysubstance abuse. There were no active signs of withdrawal noted during the assessment. His vitals signs were reviewed and are stable.   Associated Signs/Symptoms: Depression Symptoms:  depressed mood, anhedonia, difficulty concentrating, hopelessness, anxiety, (Hypo) Manic Symptoms:  Irritable Mood, Labiality of Mood, Anxiety Symptoms:  Social Anxiety, Psychotic Symptoms:  NA PTSD Symptoms: NA Total Time spent with patient: 30 minutes  Past Medical History:  Past Medical History:  Diagnosis Date  . Anxiety   . Depression   . Substance abuse     Past Surgical History:  Procedure Laterality Date  . arm surgery    . LEG SURGERY     Family History: History reviewed. No pertinent family history. Social History:  History  Alcohol Use  . Yes    Comment: drinks daily "all day"  History  Drug Use  . Types: Cocaine, Marijuana, MDMA (Ecstacy)    Comment: daily use    Social History   Social History  . Marital status: Single    Spouse name: N/A  . Number of children: N/A  . Years of education: N/A   Social History Main Topics  . Smoking status: Current Every Day Smoker    Packs/day: 1.00    Types: Cigarettes  . Smokeless tobacco: Never Used  .  Alcohol use Yes     Comment: drinks daily "all day"  . Drug use:     Types: Cocaine, Marijuana, MDMA (Ecstacy)     Comment: daily use  . Sexual activity: Yes    Partners: Female   Other Topics Concern  . None   Social History Narrative  . None   Additional Social History:  Specify valuables returned:  2 white bags   Musculoskeletal: Strength & Muscle Tone: within normal limits Gait & Station: normal Patient leans: N/A  Psychiatric Specialty Exam: Physical Exam  Vitals reviewed. Psychiatric: He has a normal mood and affect.    Review of Systems  All other systems reviewed and are negative.   Blood pressure 112/77, pulse 91, temperature 97.8 F (36.6 C), temperature source Oral, resp. rate 16, height _0  (1.93 m), weight 96.2 kg (212 lb), SpO2 98 %.Body mass index is 25.81 kg/m.   General Appearance: Casual  Eye Contact:: Fair  Speech:Normal   Volume: Normal  Mood:Euthymic   Affect: Appropriate   Thought Process: Coherent  Orientation: Full (Time, Place, and Person)  Thought Content:Concerns about his drug use  Suicidal Thoughts: No  Homicidal Thoughts: No  Memory: Immediate; Fair Recent; Fair  Judgement: Poor  Insight: Shallow  Psychomotor Activity: Normal  Concentration: Fair  Recall: [place]: Fair  Akathisia: Negative  Handed: Right  AIMS (if indicated):    Assets: Desire for Improvement Social Support  Sleep: Number of Hours: Not recorded  Cognition: WNL  ADL's: Intact       Risk to Self: Is patient at risk for suicide?: Yes Risk to Others:   No Prior Inpatient Therapy:  Yes Prior Outpatient Therapy:  Yes  Alcohol Screening:    Allergies:  No Known Allergies Lab Results:  No results found for this or any previous visit (from the past 48 hour(s)). Current Medications: No current facility-administered medications for this encounter.    Current Outpatient  Prescriptions  Medication Sig Dispense Refill  . nicotine (NICODERM CQ - DOSED IN MG/24 HOURS) 21 mg/24hr patch Place 1 patch (21 mg total) onto the skin daily. 28 patch 0  . traZODone (DESYREL) 100 MG tablet Take 1 tablet (100 mg total) by mouth at bedtime. 30 tablet 0  . ziprasidone (GEODON) 40 MG capsule Take 1 capsule (40 mg total) by mouth daily after supper. 30 capsule 0   PTA Medications: No prescriptions prior to admission.    Previous Psychotropic Medications: Yes   Substance Abuse History in the last 12 months:  Yes.   On admission patient was positive for amphetamines, cocaine, and marijuana  Consequences of Substance Abuse: Blackouts:  , Unstable mood   Results for orders placed or performed during the hospital encounter of 05/19/16 (from the past 72 hour(s))  Comprehensive metabolic panel     Status: Abnormal   Collection Time: 05/19/16  3:38 PM  Result Value Ref Range   Sodium 139 135 - 145 mmol/L   Potassium 3.8 3.5 -  5.1 mmol/L   Chloride 104 101 - 111 mmol/L   CO2 26 22 - 32 mmol/L   Glucose, Bld 91 65 - 99 mg/dL   BUN 6 6 - 20 mg/dL   Creatinine, Ser 1.28 (H) 0.61 - 1.24 mg/dL   Calcium 9.5 8.9 - 10.3 mg/dL   Total Protein 8.1 6.5 - 8.1 g/dL   Albumin 5.0 3.5 - 5.0 g/dL   AST 29 15 - 41 U/L   ALT 21 17 - 63 U/L   Alkaline Phosphatase 51 38 - 126 U/L   Total Bilirubin 1.0 0.3 - 1.2 mg/dL   GFR calc non Af Amer >60 >60 mL/min   GFR calc Af Amer >60 >60 mL/min    Comment: (NOTE) The eGFR has been calculated using the CKD EPI equation. This calculation has not been validated in all clinical situations. eGFR's persistently <60 mL/min signify possible Chronic Kidney Disease.    Anion gap 9 5 - 15  Ethanol     Status: None   Collection Time: 05/19/16  3:38 PM  Result Value Ref Range   Alcohol, Ethyl (B) <5 <5 mg/dL    Comment:        LOWEST DETECTABLE LIMIT FOR SERUM ALCOHOL IS 5 mg/dL FOR MEDICAL PURPOSES ONLY   CBC with Diff     Status: None    Collection Time: 05/19/16  3:38 PM  Result Value Ref Range   WBC 9.0 4.0 - 10.5 K/uL   RBC 4.87 4.22 - 5.81 MIL/uL   Hemoglobin 15.4 13.0 - 17.0 g/dL   HCT 43.7 39.0 - 52.0 %   MCV 89.7 78.0 - 100.0 fL   MCH 31.6 26.0 - 34.0 pg   MCHC 35.2 30.0 - 36.0 g/dL   RDW 13.9 11.5 - 15.5 %   Platelets 340 150 - 400 K/uL   Neutrophils Relative % 67 %   Neutro Abs 6.0 1.7 - 7.7 K/uL   Lymphocytes Relative 22 %   Lymphs Abs 1.9 0.7 - 4.0 K/uL   Monocytes Relative 10 %   Monocytes Absolute 0.9 0.1 - 1.0 K/uL   Eosinophils Relative 1 %   Eosinophils Absolute 0.1 0.0 - 0.7 K/uL   Basophils Relative 0 %   Basophils Absolute 0.0 0.0 - 0.1 K/uL  Salicylate level     Status: None   Collection Time: 05/19/16  3:38 PM  Result Value Ref Range   Salicylate Lvl <5.3 2.8 - 30.0 mg/dL  Acetaminophen level     Status: Abnormal   Collection Time: 05/19/16  3:38 PM  Result Value Ref Range   Acetaminophen (Tylenol), Serum <10 (L) 10 - 30 ug/mL    Comment:        THERAPEUTIC CONCENTRATIONS VARY SIGNIFICANTLY. A RANGE OF 10-30 ug/mL MAY BE AN EFFECTIVE CONCENTRATION FOR MANY PATIENTS. HOWEVER, SOME ARE BEST TREATED AT CONCENTRATIONS OUTSIDE THIS RANGE. ACETAMINOPHEN CONCENTRATIONS >150 ug/mL AT 4 HOURS AFTER INGESTION AND >50 ug/mL AT 12 HOURS AFTER INGESTION ARE OFTEN ASSOCIATED WITH TOXIC REACTIONS.   Urine rapid drug screen (hosp performed)not at [facility]     Status: Abnormal   Collection Time: 05/19/16  4:30 PM  Result Value Ref Range   Opiates NONE DETECTED NONE DETECTED   Cocaine POSITIVE (A) NONE DETECTED   Benzodiazepines NONE DETECTED NONE DETECTED   Amphetamines POSITIVE (A) NONE DETECTED   Tetrahydrocannabinol POSITIVE (A) NONE DETECTED   Barbiturates NONE DETECTED NONE DETECTED    Comment:        DRUG  SCREEN FOR MEDICAL PURPOSES ONLY.  IF CONFIRMATION IS NEEDED FOR ANY PURPOSE, NOTIFY LAB WITHIN 5 DAYS.        LOWEST DETECTABLE LIMITS FOR URINE DRUG SCREEN Drug Class       Cutoff  (ng/mL) Amphetamine      1000 Barbiturate      200 Benzodiazepine   412 Tricyclics       820 Opiates          300 Cocaine          300 THC              50     Observation Level/Precautions:  Continuous Observation  Laboratory:  per ED  Psychotherapy: Individual   Medications:  As per medlist  Consultations:  As needed  Discharge Concerns:  safety  Estimated LOS: 24-48 hours  Other:     Psychological Evaluations: Yes   Treatment Plan Summary: Daily contact with patient to assess and evaluate symptoms and progress in treatment and Medication management  Observation Level/Precautions:  Continuous Observation Laboratory:  CBC Chemistry Profile UDS Psychotherapy:  Individual  Medications:  Ativan 1 mg every six hours prn symptoms of alcohol withdrawal, Trazodone 100 mg hs for insomnia, Geodon 40 mg after supper for mood stabilization.  Consultations:  None Discharge Concerns:  Continued substance abuse Estimated LOS: 24-48 hours Other:  Referrals for residential treatment for polysubstance abuse and for outpatient follow up   Elmarie Shiley, NP-C 7/24/20174:40 PM

## 2016-05-24 ENCOUNTER — Emergency Department (HOSPITAL_COMMUNITY)
Admission: EM | Admit: 2016-05-24 | Discharge: 2016-05-26 | Payer: Federal, State, Local not specified - Other | Attending: Emergency Medicine | Admitting: Emergency Medicine

## 2016-05-24 ENCOUNTER — Encounter (HOSPITAL_COMMUNITY): Payer: Self-pay | Admitting: Emergency Medicine

## 2016-05-24 DIAGNOSIS — F17210 Nicotine dependence, cigarettes, uncomplicated: Secondary | ICD-10-CM | POA: Insufficient documentation

## 2016-05-24 DIAGNOSIS — F4310 Post-traumatic stress disorder, unspecified: Secondary | ICD-10-CM | POA: Diagnosis present

## 2016-05-24 DIAGNOSIS — F332 Major depressive disorder, recurrent severe without psychotic features: Secondary | ICD-10-CM | POA: Diagnosis present

## 2016-05-24 DIAGNOSIS — F1420 Cocaine dependence, uncomplicated: Secondary | ICD-10-CM | POA: Insufficient documentation

## 2016-05-24 DIAGNOSIS — F1290 Cannabis use, unspecified, uncomplicated: Secondary | ICD-10-CM | POA: Insufficient documentation

## 2016-05-24 DIAGNOSIS — F333 Major depressive disorder, recurrent, severe with psychotic symptoms: Secondary | ICD-10-CM | POA: Insufficient documentation

## 2016-05-24 DIAGNOSIS — F29 Unspecified psychosis not due to a substance or known physiological condition: Secondary | ICD-10-CM | POA: Diagnosis present

## 2016-05-24 DIAGNOSIS — R079 Chest pain, unspecified: Secondary | ICD-10-CM | POA: Insufficient documentation

## 2016-05-24 LAB — COMPREHENSIVE METABOLIC PANEL WITH GFR
ALT: 24 U/L (ref 17–63)
AST: 28 U/L (ref 15–41)
Albumin: 5.1 g/dL — ABNORMAL HIGH (ref 3.5–5.0)
Alkaline Phosphatase: 46 U/L (ref 38–126)
Anion gap: 9 (ref 5–15)
BUN: 19 mg/dL (ref 6–20)
CO2: 27 mmol/L (ref 22–32)
Calcium: 9.9 mg/dL (ref 8.9–10.3)
Chloride: 101 mmol/L (ref 101–111)
Creatinine, Ser: 1.28 mg/dL — ABNORMAL HIGH (ref 0.61–1.24)
GFR calc Af Amer: >60 mL/min (ref >60)
GFR calc non Af Amer: >60 mL/min (ref >60)
Glucose, Bld: 79 mg/dL (ref 65–99)
Potassium: 4.1 mmol/L (ref 3.5–5.1)
Sodium: 137 mmol/L (ref 135–145)
Total Bilirubin: 0.7 mg/dL (ref 0.3–1.2)
Total Protein: 8.6 g/dL — ABNORMAL HIGH (ref 6.5–8.1)

## 2016-05-24 LAB — CBC WITH DIFFERENTIAL/PLATELET
Basophils Absolute: 0 K/uL (ref 0.0–0.1)
Basophils Relative: 0 %
Eosinophils Absolute: 0.1 K/uL (ref 0.0–0.7)
Eosinophils Relative: 1 %
HCT: 42.2 % (ref 39.0–52.0)
Hemoglobin: 15.2 g/dL (ref 13.0–17.0)
Lymphocytes Relative: 36 %
Lymphs Abs: 2.5 K/uL (ref 0.7–4.0)
MCH: 32.3 pg (ref 26.0–34.0)
MCHC: 36 g/dL (ref 30.0–36.0)
MCV: 89.6 fL (ref 78.0–100.0)
Monocytes Absolute: 0.7 K/uL (ref 0.1–1.0)
Monocytes Relative: 10 %
Neutro Abs: 3.6 K/uL (ref 1.7–7.7)
Neutrophils Relative %: 53 %
Platelets: 336 K/uL (ref 150–400)
RBC: 4.71 MIL/uL (ref 4.22–5.81)
RDW: 13.7 % (ref 11.5–15.5)
WBC: 7 K/uL (ref 4.0–10.5)

## 2016-05-24 LAB — ETHANOL: Alcohol, Ethyl (B): <5 mg/dL (ref <5)

## 2016-05-24 LAB — ACETAMINOPHEN LEVEL: Acetaminophen (Tylenol), Serum: <10 ug/mL — ABNORMAL LOW (ref 10–30)

## 2016-05-24 LAB — SALICYLATE LEVEL: Salicylate Lvl: <4 mg/dL (ref 2.8–30.0)

## 2016-05-24 NOTE — ED Notes (Signed)
Pt.in burgundy scrubs. Pt. And belongings searched and wanded by security.pt.has 1 belongings bag.pt. Hasred shorts, 1 black t-shirt and 1 cellphone and 1 pr. Black snickers. Pt.has no money, no wallet and no watch.pt. Belongings locked up at the nurses station between RES A and B.

## 2016-05-24 NOTE — ED Notes (Signed)
Patient arrived to unit with deputies. Pt with flat affect, but is cooperative with care. Pt given two sodas upon request. Pt declined sandwiches. Pt states he only wants to rest at this time. Pt states "I am just sleepy right now, I'll just lay down thanks". No s/s of distress noted. Pt denies SI/HI at this time. No needs.

## 2016-05-24 NOTE — ED Provider Notes (Signed)
[facility]-EMERGENCY DEPT Provider Note   CSN: [id number] Arrival date & time: 05/24/16  2144  By signing my name below, I, Brandon Glass, attest that this documentation has been prepared under the direction and in the presence of [employer], PA-C. Electronically Signed: Alyssa Glass, ED Scribe. 05/24/16. 5:59 AM.  First Provider Contact:  10:05 PM    History   Chief Complaint Chief Complaint  Patient presents with  . Medical Clearance    The history is provided by the patient. No language interpreter was used.    HPI Comments: Brandon Glass is a 35 y.o. male who presents to the Emergency Department complaining of episodic hallucinations with the most recent onset tonight. Pt states we can't do anything for him. Pt states he feels as if he is being "attacked by the devil himself". Pt states he has thoughts of hurting himself. Pt states if he had weapons at home then he would be dead. Pt reports he has episodes that usually start with chest pain and heart palpitations. Pt states he was last seen, he was sent to the observation unit and then sent home. Pt denies thoughts of hurting anyone else. Pt states he felt like was "trapped and he had to go back to the source to get out of the trouble he is in". Pt states his parents state pt has a "legion of demons". Pt reports use of crack cocaine, but states he has been clean for a week. Pt states his father called the police this evening. Pt reports he blacked out tonight. Pt states he has not been taking medications for over a year. Pt states he has more anxiety when taking Celexa. Pt reports he has used non prescribed medications that worked "20x better than the ones prescribed". Pt reports he did this to his self by "playing with black magic".   Past Medical History:  Diagnosis Date  . Anxiety   . Depression   . Substance abuse     Patient Active Problem List   Diagnosis Date Noted  . Severe recurrent major depression without psychotic features  (HCC) 04/25/2015  . PTSD (post-traumatic stress disorder) 04/25/2015  . Substance abuse 04/24/2015  . Polysubstance dependence (HCC)   . Alcohol use disorder, moderate, dependence (HCC)   . Cocaine use disorder, moderate, dependence (HCC)   . Polysubstance dependence, non-opioid, continuous (HCC) 02/20/2013    Class: Chronic  . Alcohol dependence (HCC) 02/20/2013  . Cocaine dependence (HCC) 02/20/2013  . Substance induced mood disorder (HCC) 02/20/2013    Past Surgical History:  Procedure Laterality Date  . arm surgery    . LEG SURGERY       Home Medications    Prior to Admission medications   Medication Sig Start Date End Date Taking? Authorizing Provider  traZODone (DESYREL) 100 MG tablet Take 1 tablet (100 mg total) by mouth at bedtime. 05/21/16  Yes Thermon Leyland, NP  ziprasidone (GEODON) 40 MG capsule Take 1 capsule (40 mg total) by mouth daily after supper. 05/21/16  Yes Thermon Leyland, NP  nicotine (NICODERM CQ - DOSED IN MG/24 HOURS) 21 mg/24hr patch Place 1 patch (21 mg total) onto the skin daily. Patient not taking: Reported on 05/24/2016 05/21/16   Thermon Leyland, NP    Family History No family history on file.  Social History Social History  Substance Use Topics  . Smoking status: Current Every Day Smoker    Packs/day: 1.00    Types: Cigarettes  . Smokeless tobacco:  Never Used  . Alcohol use Yes     Comment: drinks daily "all day"     Allergies   Review of patient's allergies indicates no known allergies.   Review of Systems Review of Systems  Constitutional: Negative for fever.  Cardiovascular: Positive for chest pain and palpitations.  Psychiatric/Behavioral: Positive for hallucinations and suicidal ideas. The patient is nervous/anxious.   All other systems reviewed and are negative.    Physical Exam Updated Vital Signs BP 139/96 (BP Location: Right Arm)   Pulse 92   Temp 98.6 F (37 C) (Oral)   Resp 18   SpO2 97%   Physical Exam    Constitutional: He is oriented to person, place, and time. He appears well-developed and well-nourished. No distress.  HENT:  Head: Normocephalic and atraumatic.  Eyes: Conjunctivae and EOM are normal. No scleral icterus.  Neck: Normal range of motion.  Cardiovascular: Normal rate, normal heart sounds and intact distal pulses.   Pulmonary/Chest: Effort normal. No respiratory distress. He has no wheezes. He has no rales.  Musculoskeletal: Normal range of motion.  Neurological: He is alert and oriented to person, place, and time.  Skin: Skin is warm and dry. No rash noted. He is not diaphoretic. No erythema. No pallor.  Psychiatric: He has a normal mood and affect. His speech is normal. He is agitated. Thought content is paranoid. He expresses suicidal ideation. He expresses no homicidal ideation. He expresses no suicidal plans and no homicidal plans.  Nursing note and vitals reviewed.    ED Treatments / Results  DIAGNOSTIC STUDIES: Oxygen Saturation is 99% on RA, normal by my interpretation.    COORDINATION OF CARE: 10:10 PM Discussed treatment plan with pt at bedside which includes lab work and pt agreed to plan.  Labs (all labs ordered are listed, but only abnormal results are displayed) Labs Reviewed  COMPREHENSIVE METABOLIC PANEL - Abnormal; Notable for the following:       Result Value   Creatinine, Ser 1.28 (*)    Total Protein 8.6 (*)    Albumin 5.1 (*)    All other components within normal limits  ACETAMINOPHEN LEVEL - Abnormal; Notable for the following:    Acetaminophen (Tylenol), Serum <10 (*)    All other components within normal limits  CBC WITH DIFFERENTIAL/PLATELET  SALICYLATE LEVEL  ETHANOL  URINE RAPID DRUG SCREEN, HOSP PERFORMED    EKG  EKG Interpretation None       Radiology No results found.  Procedures Procedures (including critical care time)  Medications Ordered in ED Medications  traZODone (DESYREL) tablet 100 mg (100 mg Oral Not Given  05/25/16 0115)  ziprasidone (GEODON) capsule 40 mg (not administered)     Initial Impression / Assessment and Plan / ED Course  I have reviewed the triage vital signs and the nursing notes.  Pertinent labs & imaging results that were available during my care of the patient were reviewed by me and considered in my medical decision making (see chart for details).  Clinical Course    Patient medically cleared. He has been recommended for inpatient psychiatric care. No beds available at [facility]. TTS seeking placement. Disposition to be determined by oncoming ED provider.   Final Clinical Impressions(s) / ED Diagnoses   Final diagnoses:  Severe episode of recurrent major depressive disorder, with psychotic features (HCC)    New Prescriptions New Prescriptions   No medications on file   I personally performed the services described in this documentation, which was scribed in  my presence. The recorded information has been reviewed and is accurate.       Antony Madura, PA-C 05/25/16 0600    Arby Barrette, MD 05/31/16 [phone]

## 2016-05-24 NOTE — ED Notes (Signed)
Report to [facility] in [place] for patient to go to room 37

## 2016-05-24 NOTE — ED Notes (Signed)
Unable to obtain accurate blood pressure due to pt refusing to follow instructions to stay still

## 2016-05-24 NOTE — ED Triage Notes (Signed)
Pt BIB Sheriff from home; pt's father called Sheriff's office and intends to take out IVC paperwork on pt; pt states he is possessed and took part in a seance with black magic that is causing pt to act out; upon Sheriff arrival pt was screaming and acting incoherent; per father, pt was being violent earlier today.

## 2016-05-25 DIAGNOSIS — R45850 Homicidal ideations: Secondary | ICD-10-CM

## 2016-05-25 DIAGNOSIS — F332 Major depressive disorder, recurrent severe without psychotic features: Secondary | ICD-10-CM | POA: Diagnosis not present

## 2016-05-25 DIAGNOSIS — F29 Unspecified psychosis not due to a substance or known physiological condition: Secondary | ICD-10-CM | POA: Diagnosis present

## 2016-05-25 DIAGNOSIS — R45851 Suicidal ideations: Secondary | ICD-10-CM | POA: Diagnosis not present

## 2016-05-25 LAB — RAPID URINE DRUG SCREEN, HOSP PERFORMED
Amphetamines: NOT DETECTED
BARBITURATES: NOT DETECTED
BENZODIAZEPINES: NOT DETECTED
Cocaine: POSITIVE — AB
Opiates: NOT DETECTED
Tetrahydrocannabinol: POSITIVE — AB

## 2016-05-25 MED ORDER — ZIPRASIDONE HCL 20 MG PO CAPS: 40.0000 mg | ORAL_CAPSULE | Freq: Every day | ORAL | Status: DC

## 2016-05-25 MED ORDER — CARBAMAZEPINE 200 MG PO TABS
200.0000 mg | ORAL_TABLET | Freq: Two times a day (BID) | ORAL | Status: DC
  Administered 2016-05-25: 200 mg via ORAL
  Filled 2016-05-25: qty 1

## 2016-05-25 MED ORDER — OLANZAPINE 10 MG PO TBDP: 10.0000 mg | ORAL_TABLET | Freq: Three times a day (TID) | ORAL | Status: DC | PRN

## 2016-05-25 MED ORDER — FLUPHENAZINE HCL 5 MG PO TABS
5.0000 mg | ORAL_TABLET | Freq: Two times a day (BID) | ORAL | Status: DC
  Administered 2016-05-25: 5 mg via ORAL
  Filled 2016-05-25 (×2): qty 1

## 2016-05-25 MED ORDER — AMANTADINE HCL 100 MG PO CAPS
100.0000 mg | ORAL_CAPSULE | Freq: Two times a day (BID) | ORAL | Status: DC
  Administered 2016-05-25: 100 mg via ORAL
  Filled 2016-05-25: qty 1

## 2016-05-25 MED ORDER — TRAZODONE HCL 100 MG PO TABS
100.0000 mg | ORAL_TABLET | Freq: Every day | ORAL | Status: DC
  Administered 2016-05-25: 100 mg via ORAL
  Filled 2016-05-25: qty 1

## 2016-05-25 NOTE — ED Notes (Signed)
Report received from Edie Marvin RN. Patient alert and oriented, warm and dry, in no acute distress. Patient denies SI, HI, AVH and pain. Patient made aware of Q15 minute rounds and security cameras for their safety. Patient instructed to come to me with needs or concerns. 

## 2016-05-25 NOTE — ED Notes (Signed)
Pt is pleasant and cooperative.  Tearful at times.  He states he continues to hear voices.  Denies S/I and H/I.  15 minute checks and video monitoring continue.

## 2016-05-25 NOTE — ED Notes (Signed)
Patient noted in room. No complaints, stable, in no acute distress. Q15 minute rounds and monitoring via Security Cameras to continue.  

## 2016-05-25 NOTE — BH Assessment (Addendum)
Tele Assessment Note   Brandon Glass is an 35 y.o. male who presents involuntarily accompanied by his father reporting symptoms of Depression, Anxiety, SI, HI and psychosis . Pt was referred for assessment by Sheriff's Department who IVC'd him. Pt behavior is reported as threatening, violent and combative." Prior to ED visit, pt was experiencing hallucination with a command to kill himself and his son's mother. Also, per father, pt "was screaming and acting incoherent." Pt sts he has tried to shoot himself "several times" over the last two weeks.  Pt sts that once the gun jammed and once he was on the phone with his parents, did discharge the gun but did not shoot himself. Pt sts he threw the gun in the woods after he sold the bullets and clip for drugs. Past attempts include one attempt last month. Pt sts that over the last year he has been "devil worshipping" and "dabbling in black magic." Pt sts "It has been strong for the last month." Pt sts he is possessed and "being attacked by the devil." Pt has a history of Depression, Anxiety and polysubstance abuse.  Pt reports symptoms of depression including deep sadness, fatigue, excessive guilt, decreased self esteem, tearfulness & crying spells, self isolation, lack of motivation for activities and pleasure, irritability, negative outlook, difficulty thinking & concentrating, feeling helpless and hopeless, sleep and eating disturbances. Pt sts that he has been having daily panic attacks for a month due to his AH. Pt reports medication was prescribed for him when he was admitted to the [facility] Observation Unit several days ago. Pt sts he does not have a psychiatrist in the community. Pt currently sees no one for OPT.  Pt endorses homicidal ideation but no history of violence. Pt does admit anger outbursts at times with loud yelling.  Pt sts he is having thoughts of harming his son's mother because she will not let him see or talk to him on the phone. Pt sts he almost  hurt a friend within the last week "on impulse because I almost could not stop himself."  Legal history includes no violent crimes but a DWI for which pt sts he just finished community service.  Pt endorses auditory hallucinations and other psychotic symptoms including "feeling possessed by the devil" and "leeling like legions of demons attacking him."  Pt states current stressors include not being able to see his son, homelessness (currently as of recently staying with his parents) and his "addictions."   Pt lives with his parents and supports include only his parents. Pt reports completing school through the 12th grade and graduating HS. 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 Pt has poor insight and impaired judgment. Pt's memory seems intact.    Pt's previous diagnoses include Anxiety, Depression, Polysubstance Abuse, PTSD and Substances-Induced Mood D/O. Pt's tx history includes no OP treatment per pt and IP history includes psychiatric hospitalizations at [facility] multiple times. Last admission was 05/20/16 where he was admitted to the Observation Unit after admitting only passive SI and no HI. Pt admits alcohol/ substance abuse up until a week ago when he sts he stopped all suddenly.  Pt sts there is no current use of anything except cigarettes. Pt's BAL was <5 and UDS (is incomplete) for all substances tested for in the ED today.  ? MSE: Pt is dressed in scrubs, seems anxious, angry and agitated. Pt is oriented x4 with rapid, pressured speech and restless motor  behavior. Eye contact is fair as pt was crying or tearful throughout the assessment. Pt's mood is stated as anxious, depressed and angry and affect appears agitated.  Affect seems congruent with mood. Thought process is coherent/relevant but tangential, focusing on his AH, SI and that he cannot see or talk to his son. There is no indication pt is currently responding to internal stimuli but it  seems to be experiencing delusional thought content. Pt was agitated throughout assessment. Pt is currently unable to contract for safety outside the hospital stating that he is "scared of what I might do."     Diagnosis: MDD, Severe, Recurrent; R/O Substance Induced Mood D/O; Panic D/O;Cocaine Use D/O, Severe; Alcohol Use D/O, Severe; Cannabis Use D/OAmphetamine Use D/O; Tobacco Use D/O Past Medical History:  Past Medical History:  Diagnosis Date  . Anxiety   . Depression   . Substance abuse     Past Surgical History:  Procedure Laterality Date  . arm surgery    . LEG SURGERY      Family History: No family history on file.  Social History:  reports that he has been smoking Cigarettes.  He has been smoking about 1.00 pack per day. He has never used smokeless tobacco. He reports that he drinks alcohol. He reports that he uses drugs, including Cocaine, Marijuana, and MDMA (Ecstacy).  Additional Social History:  Alcohol / Drug Use Prescriptions: see MAR History of alcohol / drug use?: Yes Longest period of sobriety (when/how long): 1 week Substance #1 Name of Substance 1: Crack Cocaine 1 - Age of First Use: 52-18 yo 1 - Amount (size/oz): varies 1 - Frequency: daily 1 - Duration: 7 months to 1 year (periodic before last year) 1 - Last Use / Amount: 1 week ago Substance #2 Name of Substance 2: Alcohol 2 - Age of First Use: 16 2 - Amount (size/oz): "lots and lots" 2 - Frequency: "al day, every day" 2 - Duration: since 21-22 yo- "drinking heavily" 2 - Last Use / Amount: 1 week ago Substance #3 Name of Substance 3: Marijuana 3 - Age of First Use: 81-14 yo 3 - Amount (size/oz): varies 3 - Frequency: daily 3 - Duration: "years" 3 - Last Use / Amount: 1 week ago Substance #4 Name of Substance 4: Methamphetamine 4 - Age of First Use: 80- "new for me" 4 - Amount (size/oz): varies 4 - Frequency: daily 4 - Duration: past 2 months 4 - Last Use / Amount: 1 week ago- "what I'm  really, really carving" Substance #5 Name of Substance 5: MDMA (Ecstacy) 5 - Age of First Use: early 32s 5 - Amount (size/oz): varied 5 - Frequency: periodically 5 - Duration: unknown 5 - Last Use / Amount: unknown Substance #6 Name of Substance 6: Nicotine/Cigarettes 6 - Age of First Use: 15 6 - Amount (size/oz): 1 pack 6 - Frequency: daily 6 - Duration: ongoing 6 - Last Use / Amount: 1 week ago  CIWA: CIWA-Ar BP: 139/96 Pulse Rate: 92 COWS:    PATIENT STRENGTHS: (choose at least two) Average or above average intelligence Communication skills Supportive family/friends  Allergies: No Known Allergies  Home Medications:  (Not in a hospital admission)  OB/GYN Status:  No LMP for male patient.  General Assessment Data Location of Assessment: [facility] ED TTS Assessment: In system Is this a Tele or Face-to-Face Assessment?: Tele Assessment Is this an Initial Assessment or a Re-assessment for this encounter?: Initial Assessment Marital status: Single Living Arrangements: Parent (sts has been  homeless for yrs; now w parents) Can pt return to current living arrangement?: Yes Admission Status: Involuntary Is patient capable of signing voluntary admission?: No (IVC'd) Referral Source: Self/Family/Friend (Father called PD) Insurance type:  (Self Pay)  Medical Screening Exam [facility] Walk-in ONLY) Medical Exam completed: Yes  Crisis Care Plan Living Arrangements: Parent (sts has been homeless for yrs; now w parents) Name of Psychiatrist:  (none currently) Name of Therapist:  (none)  Education Status Is patient currently in school?: No Highest grade of school patient has completed:  (graduated HS)  Risk to self with the past 6 months Suicidal Ideation: Yes-Currently Present Has patient been a risk to self within the past 6 months prior to admission? : Yes Suicidal Intent: Yes-Currently Present Has patient had any suicidal intent within the past 6 months prior to admission? :  Yes Is patient at risk for suicide?: Yes Suicidal Plan?: Yes-Currently Present Has patient had any suicidal plan within the past 6 months prior to admission? : Yes Specify Current Suicidal Plan:  (Tried to shoot himself several times recently) Access to Means: No (Sts threw gun over fence/no bullets left, no clip) Specify Access to Suicidal Means:  (had a gun until today) What has been your use of drugs/alcohol within the last 12 months?:  (daily until last week per pt) Previous Attempts/Gestures: Yes How many times?:  (3) Other Self Harm Risks:  (none noted) Triggers for Past Attempts: None known Intentional Self Injurious Behavior: None Family Suicide History: Unknown Recent stressful life event(s): Conflict (Comment), Loss (Comment), Financial Problems (Conflict w son's mom; can't see son; homeless) Persecutory voices/beliefs?: Yes Depression: Yes Depression Symptoms: Despondent, Insomnia, Tearfulness, Isolating, Fatigue, Guilt, Loss of interest in usual pleasures, Feeling worthless/self pity, Feeling angry/irritable Substance abuse history and/or treatment for substance abuse?: Yes Suicide prevention information given to non-admitted patients: Not applicable  Risk to Others within the past 6 months Homicidal Ideation: Yes-Currently Present Does patient have any lifetime risk of violence toward others beyond the six months prior to admission? : Yes (comment) (sts thoughts of harming his son's mother) Thoughts of Harm to Others: Yes-Currently Present (sts thoughts of harming his son's mother) Current Homicidal Intent: Yes-Currently Present Current Homicidal Plan: No Access to Homicidal Means: No Identified Victim:  (Son's mom is keeping him from seeing his son) History of harm to others?: No (denies) Assessment of Violence: In past 6-12 months Does patient have access to weapons?: No Criminal Charges Pending?: No (denies) Does patient have a court date: No Is patient on  probation?: No  Psychosis Hallucinations: Auditory, With command (Sts AH telling him to hurt himself & son's mother) Delusions: Persecutory  Mental Status Report Appearance/Hygiene: Disheveled, In scrubs Eye Contact: Fair (pt drying throughout and rubbing head w hands) Motor Activity: Agitation Speech: Logical/coherent, Rapid, Pressured Level of Consciousness: Alert, Restless, Irritable Mood: Depressed, Anxious Affect: Angry, Anxious, Depressed, Flat Anxiety Level: Panic Attacks (sts having attack daily for last month) Panic attack frequency:  (daily) Most recent panic attack:  (today) Thought Processes: Coherent, Relevant, Tangential Judgement:  (focus in on not being able to see his son & AH) Orientation: Person, Place, Time, Situation Obsessive Compulsive Thoughts/Behaviors: Unable to Assess  Cognitive Functioning Concentration: Fair Memory: Recent Intact, Remote Intact IQ: Above Average Insight: Poor Impulse Control: Poor Appetite: Fair Weight Loss:  (0) Weight Gain:  (0) Sleep: Decreased Total Hours of Sleep:  ("not sure") Vegetative Symptoms: Staying in bed, Decreased grooming  ADLScreening [facility] Assessment Services) Patient's cognitive ability adequate to  safely complete daily activities?: Yes Patient able to express need for assistance with ADLs?: Yes Independently performs ADLs?: Yes (appropriate for developmental age)  Prior Inpatient Therapy Prior Inpatient Therapy: Yes (Multiple @ [facility]; Last one last week on OBS Uint) Prior Therapy Facilty/Provider(s):  ([facility]) Reason for Treatment: Depression, Psychosis  Prior Outpatient Therapy Prior Outpatient Therapy: No Does patient have an ACCT team?: No Does patient have Intensive In-House Services?  : No Does patient have [facility] services? : No Does patient have P4CC services?: No  ADL Screening (condition at time of admission) Patient's cognitive ability adequate to safely complete daily activities?:  Yes Patient able to express need for assistance with ADLs?: Yes Independently performs ADLs?: Yes (appropriate for developmental age)       Abuse/Neglect Assessment (Assessment to be complete while patient is alone) Physical Abuse:  (UTA) Verbal Abuse:  (UTA) Sexual Abuse:  (UTA) Exploitation of patient/patient's resources: Denies Self-Neglect: Yes, present (Comment) (due to drug use)     Merchant navy officer (For Healthcare) Does patient have an advance directive?: No Would patient like information on creating an advanced directive?: No - patient declined information    Additional Information 1:1 In Past 12 Months?: Yes CIRT Risk: Yes Elopement Risk: Yes Does patient have medical clearance?: Yes     Disposition:  Disposition Initial Assessment Completed for this Encounter: Yes Disposition of Patient: Other dispositions Other disposition(s): Other (Comment) (Pending review w [facility] Ext[facility])  Per Alberteen Sam, NP: Pt meets IP criteria. Recommend IP tx.  Per Clint Bolder, AC: No appropriate beds currently available at [facility]. TTS to seek placement.   Spoke to [employer], PA-C: Advised of recommendation. She agreed.  Beryle Flock, MS, CRC, [facility] Triage Specialist [facility] T 05/25/2016 12:40 AM

## 2016-05-25 NOTE — BH Assessment (Signed)
[facility] Assessment Progress Note  Patient accepted to [facility] 507-2 at 20:00 hrs

## 2016-05-25 NOTE — ED Notes (Signed)
Patient noted sleeping in room. No complaints, stable, in no acute distress. Q15 minute rounds and monitoring via Security Cam[employer] to continue.  

## 2016-05-25 NOTE — Progress Notes (Signed)
   05/25/16 1400  Clinical Encounter Type  Visited With Patient  Visit Type Initial;Psychological support;Spiritual support;ED;Behavioral Health  Referral From Nurse  Consult/Referral To Chaplain  Spiritual Encounters  Spiritual Needs Emotional;Other (Comment);Literature;Sacred text Education officer, museum)  Stress Factors  Patient Stress Factors Other (Comment);Major life changes (Spiritual Concerns )   I visited with the patient per referral by the nurse who stated that the patient has ongoing religious ideations.  Brandon Glass was receptive to a visit from the Chaplain and open about his experiences with black magic practices. Brandon Glass was coherent and truly believes on a Spiritual level that he has "opened himself" up to darker spirits. He believes in "spiritual warfare." Brandon Glass wants to get back to [name] so that he can heal from his addiction and be the way he used to be.  The patient told me about his experiences and believes that he has been attacked by these evil spirits before.  He understands that his drug use might have been the cause; but due to his strong spiritual belief, he is skeptical. I believe that the patient will need coping mechanisms that are consistent with what he is going through spiritually. He responds well to things that affirm his [place] faith.  Brandon Glass does not like to feel that staff are judging him for his beliefs or "making fun of him." The patient stated that he felt that I was a compassionate presence and that I wasn't there to "make fun of him."  Brandon Glass asked me for a Rosary and some Actuary on prayer. I told him that I would get him these materials, but that I would have to get this approved by staff and that he might not be able to have the Rosary. Staff stated that they would place the Rosary in his belongings.   Follow-up is necessary.    Chaplain Clint Bolder M.Div.

## 2016-05-25 NOTE — BH Assessment (Signed)
[facility] Assessment Progress Note  Per Thedore Mins, MD, this pt requires psychiatic hospitalization at this time;  The following facilities have been contacted to seek placement for this pt, with results as noted:  Beds available, information sent, decision pending:  Brandon Glass   At capacity:  [facility] Erling Conte, [place] Triage Specialist [phone]

## 2016-05-25 NOTE — Consult Note (Signed)
[place] [facility] Consult   Reason for Consult:  Hallucinations with suicidal and homicidal ideations Referring Physician:  EDP Patient Identification: Brandon Glass MRN:  [id number] Principal Diagnosis: Severe recurrent major depression without psychotic features [facility]) Diagnosis:   Patient Active Problem List   Diagnosis Date Noted  . Severe recurrent major depression without psychotic features ([place]) [F33.2] 04/25/2015    Priority: High  . Cocaine dependence ([place]) [F14.20] 02/20/2013    Priority: High  . Psychosis [F29] 05/25/2016  . PTSD (post-traumatic stress disorder) [F43.10] 04/25/2015  . Substance abuse [F19.10] 04/24/2015  . Polysubstance dependence ([place]) [F19.20]   . Alcohol use disorder, moderate, dependence ([place]) [F10.20]   . Cocaine use disorder, moderate, dependence ([place]) [F14.20]   . Polysubstance dependence, non-opioid, continuous ([place]) [F19.20] 02/20/2013    Class: Chronic  . Alcohol dependence ([place]) [F10.20] 02/20/2013  . Substance induced mood disorder [facility]) [F19.94] 02/20/2013    Total Time spent with patient: 45 minutes  Subjective:   Brandon Glass is a 35 y.o. male patient admitted with psychosis and depression.  HPI:  On admission:  35 y.o. male who presents involuntarily accompanied by his father reporting symptoms of Depression, Anxiety, SI, HI and psychosis . Pt was referred for assessment by [employer] who IVC'd him. Pt behavior is reported as threatening, violent and combative." Prior to ED visit, pt was experiencing hallucination with a command to kill himself and his son's mother. Also, per father, pt "was screaming and acting incoherent." Pt sts he has tried to shoot himself "several times" over the last two weeks.  Pt sts that once the gun jammed and once he was on the phone with his parents, did discharge the gun but did not shoot himself. Pt sts he threw the gun in the woods after he sold the bullets and clip for drugs. Past  attempts include one attempt last month. Pt sts that over the last year he has been "devil worshipping" and "dabbling in black magic." Pt sts "It has been strong for the last month." Pt sts he is possessed and "being attacked by the devil." Pt has a history of Depression, Anxiety and polysubstance abuse.  Pt reports symptoms of depression including deep sadness, fatigue, excessive guilt, decreased self esteem, tearfulness & crying spells, self isolation, lack of motivation for activities and pleasure, irritability, negative outlook, difficulty thinking & concentrating, feeling helpless and hopeless, sleep and eating disturbances. Pt sts that he has been having daily panic attacks for a month due to his AH. Pt reports medication was prescribed for him when he was admitted to the [facility] Observation Unit several days ago. Pt sts he does not have a psychiatrist in the community. Pt currently sees no one for OPT.  Pt endorses homicidal ideation but no history of violence. Pt does admit anger outbursts at times with loud yelling.  Pt sts he is having thoughts of harming his son's mother because she will not let him see or talk to him on the phone. Pt sts he almost hurt a friend within the last week "on impulse because I almost could not stop himself."  Legal history includes no violent crimes but a DWI for which pt sts he just finished community service.  Pt endorses auditory hallucinations and other psychotic symptoms including "feeling possessed by the devil" and "leeling like legions of demons attacking him."  Pt states current stressors include not being able to see his son, homelessness (currently as of recently staying with his parents) and  his "addictions."   Pt lives with his parents and supports include only his parents. Pt reports completing school through the 12th grade and graduating HS. 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 Pt has poor insight and impaired  judgment. Pt's memory seems intact.    Pt's previous diagnoses include Anxiety, Depression, Polysubstance Abuse, PTSD and Substances-Induced Mood D/O. Pt's tx history includes no OP treatment per pt and IP history includes psychiatric hospitalizations at [facility] multiple times. Last admission was 05/20/16 where he was admitted to the Observation Unit after admitting only passive SI and no HI. Pt admits alcohol/ substance abuse up until a week ago when he sts he stopped all suddenly.  Pt sts there is no current use of anything except cigarettes. Pt's BAL was <5 and UDS (is incomplete) for all substances tested for in the ED today.  ? MSE: Pt is dressed in scrubs, seems anxious, angry and agitated. Pt is oriented x4 with rapid, pressured speech and restless motor behavior. Eye contact is fair as pt was crying or tearful throughout the assessment. Pt's mood is stated as anxious, depressed and angry and affect appears agitated.  Affect seems congruent with mood. Thought process is coherent/relevant but tangential, focusing on his AH, SI and that he cannot see or talk to his son. There is no indication pt is currently responding to internal stimuli but it seems to be experiencing delusional thought content. Pt was agitated throughout assessment. Pt is currently unable to contract for safety outside the hospital stating that he is "scared of what I might do."    Today:  He is tearful on assessment and endorses voices telling him to kill himself, cocaine positive.  "I know I'm possessed."  He reports he started dealing with [employer] and [employer]ers, "Not been right since."  He does not appear to be responding to hallucinations but tearful on assessment.  He says the voices also encourage him to hurt other people.  Stressed with not seeing his son and his ex.  After assessment, the patient's demeanor changed and socializing appropriately in the ED and talking frequently on the phone.  Past Psychiatric  History: depression, substance abuse  Risk to Self: Suicidal Ideation: Yes-Currently Present Suicidal Intent: Yes-Currently Present Is patient at risk for suicide?: Yes Suicidal Plan?: Yes-Currently Present Specify Current Suicidal Plan:  (Tried to shoot himself several times recently) Access to Means: No (Sts threw gun over fence/no bullets left, no clip) Specify Access to Suicidal Means:  (had a gun until today) What has been your use of drugs/alcohol within the last 12 months?:  (daily until last week per pt) How many times?:  (3) Other Self Harm Risks:  (none noted) Triggers for Past Attempts: None known Intentional Self Injurious Behavior: None Risk to Others: Homicidal Ideation: Yes-Currently Present Thoughts of Harm to Others: Yes-Currently Present (sts thoughts of harming his son's mother) Current Homicidal Intent: Yes-Currently Present Current Homicidal Plan: No Access to Homicidal Means: No Identified Victim:  (Son's mom is keeping him from seeing his son) History of harm to others?: No (denies) Assessment of Violence: In past 6-12 months Does patient have access to weapons?: No Criminal Charges Pending?: No (denies) Does patient have a court date: No Prior Inpatient Therapy: Prior Inpatient Therapy: Yes (Multiple @ [facility]; Last one last week on [facility] [place]) Prior Therapy Facilty/Provider(s):  ([facility]) Reason for Treatment: Depression, Psychosis Prior Outpatient Therapy: Prior Outpatient Therapy: No  Does patient have an ACCT team?: No Does patient have Intensive In-House Services?  : No Does patient have [facility] services? : No Does patient have P4CC services?: No  Past Medical History:  Past Medical History:  Diagnosis Date  . Anxiety   . Depression   . Substance abuse     Past Surgical History:  Procedure Laterality Date  . arm surgery    . LEG SURGERY     Family History: No family history on file. Family Psychiatric  History: none Social History:  History   Alcohol Use  . Yes    Comment: drinks daily "all day"     History  Drug Use  . Types: Cocaine, Marijuana, MDMA (Ecstacy)    Comment: daily use    Social History   Social History  . Marital status: Single    Spouse name: N/A  . Number of children: N/A  . Years of education: N/A   Social History Main Topics  . Smoking status: Current Every Day Smoker    Packs/day: 1.00    Types: Cigarettes  . Smokeless tobacco: Never Used  . Alcohol use Yes     Comment: drinks daily "all day"  . Drug use:     Types: Cocaine, Marijuana, MDMA (Ecstacy)     Comment: daily use  . Sexual activity: Yes    Partners: Female   Other Topics Concern  . None   Social History Narrative  . None   Additional Social History:    Allergies:  No Known Allergies  Labs:  Results for orders placed or performed during the hospital encounter of 05/24/16 (from the past 48 hour(s))  CBC with Differential     Status: None   Collection Time: 05/24/16 10:28 PM  Result Value Ref Range   WBC 7.0 4.0 - 10.5 K/uL   RBC 4.71 4.22 - 5.81 MIL/uL   Hemoglobin 15.2 13.0 - 17.0 g/dL   HCT 42.2 39.0 - 52.0 %   MCV 89.6 78.0 - 100.0 fL   MCH 32.3 26.0 - 34.0 pg   MCHC 36.0 30.0 - 36.0 g/dL   RDW 13.7 11.5 - 15.5 %   Platelets 336 150 - 400 K/uL   Neutrophils Relative % 53 %   Neutro Abs 3.6 1.7 - 7.7 K/uL   Lymphocytes Relative 36 %   Lymphs Abs 2.5 0.7 - 4.0 K/uL   Monocytes Relative 10 %   Monocytes Absolute 0.7 0.1 - 1.0 K/uL   Eosinophils Relative 1 %   Eosinophils Absolute 0.1 0.0 - 0.7 K/uL   Basophils Relative 0 %   Basophils Absolute 0.0 0.0 - 0.1 K/uL  Comprehensive metabolic panel     Status: Abnormal   Collection Time: 05/24/16 10:28 PM  Result Value Ref Range   Sodium 137 135 - 145 mmol/L   Potassium 4.1 3.5 - 5.1 mmol/L   Chloride 101 101 - 111 mmol/L   CO2 27 22 - 32 mmol/L   Glucose, Bld 79 65 - 99 mg/dL   BUN 19 6 - 20 mg/dL   Creatinine, Ser 1.28 (H) 0.61 - 1.24 mg/dL   Calcium 9.9  8.9 - 10.3 mg/dL   Total Protein 8.6 (H) 6.5 - 8.1 g/dL   Albumin 5.1 (H) 3.5 - 5.0 g/dL   AST 28 15 - 41 U/L   ALT 24 17 - 63 U/L   Alkaline Phosphatase 46 38 - 126 U/L   Total Bilirubin 0.7 0.3 - 1.2 mg/dL   GFR  calc non Af Amer >60 >60 mL/min   GFR calc Af Amer >60 >60 mL/min    Comment: (NOTE) The eGFR has been calculated using the CKD EPI equation. This calculation has not been validated in all clinical situations. eGFR's persistently <60 mL/min signify possible Chronic Kidney Disease.    Anion gap 9 5 - 15  Acetaminophen level     Status: Abnormal   Collection Time: 05/24/16 10:28 PM  Result Value Ref Range   Acetaminophen (Tylenol), Serum <10 (L) 10 - 30 ug/mL    Comment:        THERAPEUTIC CONCENTRATIONS VARY SIGNIFICANTLY. A RANGE OF 10-30 ug/mL MAY BE AN EFFECTIVE CONCENTRATION FOR MANY PATIENTS. HOWEVER, SOME ARE BEST TREATED AT CONCENTRATIONS OUTSIDE THIS RANGE. ACETAMINOPHEN CONCENTRATIONS >150 ug/mL AT 4 HOURS AFTER INGESTION AND >50 ug/mL AT 12 HOURS AFTER INGESTION ARE OFTEN ASSOCIATED WITH TOXIC REACTIONS.   Salicylate level     Status: None   Collection Time: 05/24/16 10:28 PM  Result Value Ref Range   Salicylate Lvl <4.7 2.8 - 30.0 mg/dL  Ethanol     Status: None   Collection Time: 05/24/16 10:28 PM  Result Value Ref Range   Alcohol, Ethyl (B) <5 <5 mg/dL    Comment:        LOWEST DETECTABLE LIMIT FOR SERUM ALCOHOL IS 5 mg/dL FOR MEDICAL PURPOSES ONLY   Urine rapid drug screen (hosp performed)     Status: Abnormal   Collection Time: 05/25/16 11:35 AM  Result Value Ref Range   Opiates NONE DETECTED NONE DETECTED   Cocaine POSITIVE (A) NONE DETECTED   Benzodiazepines NONE DETECTED NONE DETECTED   Amphetamines NONE DETECTED NONE DETECTED   Tetrahydrocannabinol POSITIVE (A) NONE DETECTED   Barbiturates NONE DETECTED NONE DETECTED    Comment:        DRUG SCREEN FOR MEDICAL PURPOSES ONLY.  IF CONFIRMATION IS NEEDED FOR ANY PURPOSE, NOTIFY  LAB WITHIN 5 DAYS.        LOWEST DETECTABLE LIMITS FOR URINE DRUG SCREEN Drug Class       Cutoff (ng/mL) Amphetamine      1000 Barbiturate      200 Benzodiazepine   829 Tricyclics       562 Opiates          300 Cocaine          300 THC              50     Current Facility-Administered Medications  Medication Dose Route Frequency Provider Last Rate Last Dose  . amantadine (SYMMETREL) capsule 100 mg  100 mg Oral BID Corena Pilgrim, MD      . carbamazepine (TEGRETOL) tablet 200 mg  200 mg Oral BID PC Georgeana Oertel, MD      . fluPHENAZine (PROLIXIN) tablet 5 mg  5 mg Oral BID PC Roni Friberg, MD      . OLANZapine zydis (ZYPREXA) disintegrating tablet 10 mg  10 mg Oral Q8H PRN Aldean Pipe, MD      . traZODone (DESYREL) tablet 100 mg  100 mg Oral QHS Lurena Nida, NP       Current Outpatient Prescriptions  Medication Sig Dispense Refill  . traZODone (DESYREL) 100 MG tablet Take 1 tablet (100 mg total) by mouth at bedtime. 30 tablet 0  . ziprasidone (GEODON) 40 MG capsule Take 1 capsule (40 mg total) by mouth daily after supper. 30 capsule 0  . nicotine (NICODERM CQ - DOSED IN MG/24 HOURS)  21 mg/24hr patch Place 1 patch (21 mg total) onto the skin daily. (Patient not taking: Reported on 05/24/2016) 28 patch 0    Musculoskeletal: Strength & Muscle Tone: within normal limits Gait & Station: normal Patient leans: N/A  Psychiatric Specialty Exam: Physical Exam  Constitutional: He is oriented to person, place, and time. He appears well-developed and well-nourished.  HENT:  Head: Normocephalic.  Neck: Normal range of motion.  Respiratory: Effort normal.  Musculoskeletal: Normal range of motion.  Neurological: He is alert and oriented to person, place, and time.  Skin: Skin is warm and dry.  Psychiatric: His speech is normal. Judgment normal. His mood appears anxious. He is actively hallucinating. Cognition and memory are normal. He exhibits a depressed mood. He expresses  homicidal and suicidal ideation. He expresses suicidal plans.    Review of Systems  Constitutional: Negative.   HENT: Negative.   Eyes: Negative.   Respiratory: Negative.   Cardiovascular: Negative.   Gastrointestinal: Negative.   Genitourinary: Negative.   Musculoskeletal: Negative.   Skin: Negative.   Neurological: Negative.   Endo/Heme/Allergies: Negative.   Psychiatric/Behavioral: Positive for depression, hallucinations, substance abuse and suicidal ideas. The patient is nervous/anxious.     Blood pressure 118/78, pulse 73, temperature 98.4 F (36.9 C), temperature source Oral, resp. rate 16, SpO2 100 %.There is no height or weight on file to calculate BMI.  General Appearance: Casual  Eye Contact:  Fair  Speech:  Normal Rate  Volume:  Normal  Mood:  Depressed  Affect:  Congruent  Thought Process:  Coherent and Descriptions of Associations: Intact  Orientation:  Full (Time, Place, and Person)  Thought Content:  Hallucinations: Auditory  Suicidal Thoughts:  Yes.  with intent/plan  Homicidal Thoughts:  Yes.  without intent/plan  Memory:  Immediate;   Fair Recent;   Fair Remote;   Fair  Judgement:  Fair  Insight:  Fair  Psychomotor Activity:  Normal  Concentration:  Concentration: Good and Attention Span: Good  Recall:  [employer] of Knowledge:  Fair  Language:  Good  Akathisia:  No  Handed:  Right  AIMS (if indicated):     Assets:  Leisure Time Physical Health Resilience Social Support  ADL's:  Intact  Cognition:  WNL  Sleep:        Treatment Plan Summary: Daily contact with patient to assess and evaluate symptoms and progress in treatment, Medication management and Plan major depressive disorder, recurrent, severe without psychosis:  -Crisis stabilization -Medication management:  Start Prolixin 5 mg BID for psychosis, Zyprexa 10 mg every 8 hours PRN agitation, Tegretol 200 mg BID for mood, and Symmetrel 100 mg BID for EPS.   Continue his Trazodone 100 mg at  bedtime for sleep. Discontinue Geodon 40 mg daily for psychosis. -Individual and substance abuse counseling  Disposition: Recommend psychiatric Inpatient admission when medically cleared.  Waylan Boga, NP 05/25/2016 12:46 PM  Patient seen face-to-face for psychiatric evaluation, chart reviewed and case discussed with the physician extender and developed treatment plan. Reviewed the information documented and agree with the treatment plan. Corena Pilgrim, MD

## 2016-05-26 ENCOUNTER — Encounter (HOSPITAL_COMMUNITY): Payer: Self-pay

## 2016-05-26 ENCOUNTER — Inpatient Hospital Stay (HOSPITAL_COMMUNITY)
Admission: AD | Admit: 2016-05-26 | Discharge: 2016-05-28 | DRG: 885 | Disposition: A | Discharge status: Home / Self Care | Payer: Federal, State, Local not specified - Other | Attending: Psychiatry | Admitting: Psychiatry

## 2016-05-26 DIAGNOSIS — F1020 Alcohol dependence, uncomplicated: Secondary | ICD-10-CM | POA: Diagnosis present

## 2016-05-26 DIAGNOSIS — F1920 Other psychoactive substance dependence, uncomplicated: Secondary | ICD-10-CM

## 2016-05-26 DIAGNOSIS — R45851 Suicidal ideations: Secondary | ICD-10-CM | POA: Diagnosis present

## 2016-05-26 DIAGNOSIS — F17210 Nicotine dependence, cigarettes, uncomplicated: Secondary | ICD-10-CM | POA: Diagnosis present

## 2016-05-26 DIAGNOSIS — F329 Major depressive disorder, single episode, unspecified: Secondary | ICD-10-CM | POA: Diagnosis present

## 2016-05-26 DIAGNOSIS — F323 Major depressive disorder, single episode, severe with psychotic features: Secondary | ICD-10-CM | POA: Diagnosis present

## 2016-05-26 DIAGNOSIS — F1994 Other psychoactive substance use, unspecified with psychoactive substance-induced mood disorder: Secondary | ICD-10-CM | POA: Diagnosis not present

## 2016-05-26 DIAGNOSIS — F1420 Cocaine dependence, uncomplicated: Secondary | ICD-10-CM | POA: Diagnosis present

## 2016-05-26 DIAGNOSIS — F4310 Post-traumatic stress disorder, unspecified: Secondary | ICD-10-CM | POA: Diagnosis present

## 2016-05-26 MED ORDER — ACETAMINOPHEN 325 MG PO TABS: 650.0000 mg | ORAL_TABLET | Freq: Four times a day (QID) | ORAL | Status: DC | PRN

## 2016-05-26 MED ORDER — ALUM & MAG HYDROXIDE-SIMETH 200-200-20 MG/5ML PO SUSP: 30.0000 mL | ORAL | Status: DC | PRN

## 2016-05-26 MED ORDER — MAGNESIUM HYDROXIDE 400 MG/5ML PO SUSP: 30.0000 mL | Freq: Every day | ORAL | Status: DC | PRN

## 2016-05-26 MED ORDER — AMANTADINE HCL 100 MG PO CAPS
100.0000 mg | ORAL_CAPSULE | Freq: Two times a day (BID) | ORAL | Status: DC
  Administered 2016-05-26 – 2016-05-28 (×4): 100 mg via ORAL
  Filled 2016-05-26 (×6): qty 1

## 2016-05-26 MED ORDER — NICOTINE 21 MG/24HR TD PT24
21.0000 mg | MEDICATED_PATCH | Freq: Every day | TRANSDERMAL | Status: DC
  Administered 2016-05-26 – 2016-05-28 (×3): 21 mg via TRANSDERMAL
  Filled 2016-05-26 (×4): qty 1

## 2016-05-26 MED ORDER — GABAPENTIN 300 MG PO CAPS
300.0000 mg | ORAL_CAPSULE | Freq: Two times a day (BID) | ORAL | Status: DC
  Administered 2016-05-26 – 2016-05-28 (×4): 300 mg via ORAL
  Filled 2016-05-26 (×6): qty 1

## 2016-05-26 MED ORDER — FLUPHENAZINE HCL 5 MG PO TABS
5.0000 mg | ORAL_TABLET | Freq: Two times a day (BID) | ORAL | Status: DC
  Administered 2016-05-26 – 2016-05-28 (×4): 5 mg via ORAL
  Filled 2016-05-26 (×6): qty 1

## 2016-05-26 MED ORDER — TRAZODONE HCL 100 MG PO TABS
100.0000 mg | ORAL_TABLET | Freq: Every evening | ORAL | Status: DC | PRN
  Administered 2016-05-26 – 2016-05-27 (×2): 100 mg via ORAL
  Filled 2016-05-26: qty 14
  Filled 2016-05-26 (×2): qty 1
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
## 2016-05-26 NOTE — Progress Notes (Signed)
D.  Pt pleasant on approach, denies complaints at this time.  Positive for evening wrap up group, interacting appropriately with peers on the unit.  Hopeful for discharge on Monday.  A.  Support and encouragement offered, medication given as ordered  R.  Pt remains safe on the unit, will continue to monitor.

## 2016-05-26 NOTE — Progress Notes (Signed)
DAR NOTE: Patient presents with flat affect and depressed mood.  Denies pain, auditory and visual hallucinations.  Rates depression at 7, hopelessness at 8, and anxiety at 5.  Maintained on routine safety checks.  Medications given as prescribed.  Support and encouragement offered as needed.  Attended group and participated.  States goal for today is "patience and mental stability."  Patient observed socializing with peers in the dayroom.  Patient states he need help to turn his life around.  Patient states he is willing and ready to comply with treatment to help him stay clean from drugs.

## 2016-05-26 NOTE — ED Notes (Signed)
Patient noted sleeping in room. No complaints, stable, in no acute distress. Q15 minute rounds and monitoring via Security Cam[employer] to continue.  

## 2016-05-26 NOTE — [facility] Group Notes (Signed)
[facility] Group Notes:  (Nursing/MHT/Case Management/Adjunct)  Date:  05/26/2016  Time:  6:55 PM  Type of Therapy:  Nurse Education  Participation Level:  Active  Participation Quality:  Appropriate and Attentive  Affect:  Appropriate  Cognitive:  Alert and Appropriate  Insight:  Appropriate and Good  Engagement in Group:  Engaged  Modes of Intervention:  Discussion and Education  Summary of Progress/Problems: Topic was on healthy coping skills.  Discussed different copings that works and is effective.  Group encouraged to surround self with a good support system. Support systems are there to motivate, encourage, and assist with learning new coping skills that leads to a healthy lifestyle.  Patient was attentive and receptive. Mickie Bail 05/26/2016, 6:55 PM

## 2016-05-26 NOTE — Tx Team (Signed)
Initial Interdisciplinary Treatment Plan   PATIENT STRESSORS: Financial difficulties Marital or family conflict Medication change or noncompliance Substance abuse   PATIENT STRENGTHS: Ability for insight Average or above average intelligence Communication skills General fund of knowledge Motivation for treatment/growth Religious Affiliation   PROBLEM LIST: Problem List/Patient Goals Date to be addressed Date deferred Reason deferred Estimated date of resolution  Risk for suicide 05/26/16     depression 05/26/16     Psychosis 05/26/16     "mental stability, patience" 05/26/16                                    DISCHARGE CRITERIA:  Ability to meet basic life and health needs Adequate post-discharge living arrangements Improved stabilization in mood, thinking, and/or behavior Verbal commitment to aftercare and medication compliance  PRELIMINARY DISCHARGE PLAN: Attend aftercare/continuing care group Outpatient therapy  PATIENT/FAMIILY INVOLVEMENT: This treatment plan has been presented to and reviewed with the patient, Brandon Glass.  The patient and family have been given the opportunity to ask questions and make suggestions.  [name] 05/26/2016, 3:07 AM

## 2016-05-26 NOTE — Progress Notes (Signed)
Adult Psychoeducational Group Note  Date:  05/26/2016 Time:  8:46 PM  Group Topic/Focus:  Wrap-Up Group:   The focus of this group is to help patients review their daily goal of treatment and discuss progress on daily workbooks.   Participation Level:  Minimal  Participation Quality:  Appropriate  Affect:  Appropriate  Cognitive:  Alert  Insight: Good  Engagement in Group:  Improving  Modes of Intervention:  Discussion  Additional Comments:  Pt stated that today was a good day. His goal is to prepare for rehab Monday.  Kaleen Odea R 05/26/2016, 8:46 PM
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
## 2016-05-26 NOTE — [facility] Suicide Risk Assessment (Signed)
[facility] Admission Suicide Risk Assessment   Nursing information obtained from:    Demographic factors:    Current Mental Status:    Loss Factors:    Historical Factors:    Risk Reduction Factors:     Total Time spent with patient: 30 minutes Principal Problem: MDD (major depressive disorder) ([facility]) Diagnosis:   Patient Active Problem List   Diagnosis Date Noted  . Psychosis [F29] 05/25/2016    Priority: High  . PTSD (post-traumatic stress disorder) [F43.10] 04/25/2015    Priority: High  . MDD (major depressive disorder) ([facility]) [F32.9] 05/26/2016  . Severe recurrent major depression without psychotic features ([facility]) [F33.2] 04/25/2015  . Substance abuse [F19.10] 04/24/2015  . Polysubstance dependence ([facility]) [F19.20]   . Alcohol use disorder, moderate, dependence ([facility]) [F10.20]   . Cocaine use disorder, moderate, dependence (H[facility]) [F14.20]   . Polysubstance dependence, non-opioid, continuous ([facility]) [F19.20] 02/20/2013    Class: Chronic  . Alcohol dependence (HCC) [F10.20] 02/20/2013  . Cocaine dependence (HCC) [F14.20] 02/20/2013  . Substance induced mood disorder [facility]) [F19.94] 02/20/2013   Subjective Data: Patient with history of PTSD and substance abuse who presents with worsening depression, psychosis, delusions and suicidal thoughts.  Continued Clinical Symptoms:  Alcohol Use Disorder Identification Test Final Score (AUDIT): 33 The "Alcohol Use Disorders Identification Test", Guidelines for Use in Primary Care, Second Edition.  World Science writer [facility]). Score between 0-7:  no or low risk or alcohol related problems. Score between 8-15:  moderate risk of alcohol related problems. Score between 16-19:  high risk of alcohol related problems. Score 20 or above:  warrants further diagnostic evaluation for alcohol dependence and treatment.   CLINICAL FACTORS:   Severe Anxiety and/or Agitation Depression:   Aggression Comorbid alcohol  abuse/dependence Delusional Impulsivity Insomnia Severe Alcohol/Substance Abuse/Dependencies Currently Psychotic   Musculoskeletal: Strength & Muscle Tone: within normal limits Gait & Station: normal Patient leans: N/A  Psychiatric Specialty Exam: Physical Exam  Psychiatric: His speech is normal. His affect is angry and labile. He is agitated, aggressive and actively hallucinating. Thought content is paranoid. Cognition and memory are normal. He expresses impulsivity. He exhibits a depressed mood. He expresses suicidal ideation.    Review of Systems  Constitutional: Negative.   HENT: Negative.   Eyes: Negative.   Respiratory: Negative.   Cardiovascular: Negative.   Gastrointestinal: Negative.   Genitourinary: Negative.   Musculoskeletal: Negative.   Skin: Negative.   Endo/Heme/Allergies: Negative.   Psychiatric/Behavioral: Positive for depression, hallucinations, substance abuse and suicidal ideas. The patient has insomnia.     Blood pressure 121/69, pulse (!) 101, temperature 98.9 F (37.2 C), temperature source Oral, resp. rate 16, height  (1.93 m), weight 98.4 kg (217 lb).Body mass index is 26.41 kg/m.  General Appearance: Casual and multiple tattoos  Eye Contact:  Good  Speech:  Clear and Coherent  Volume:  Increased  Mood:  Anxious, Depressed and Irritable  Affect:  Labile  Thought Process:  Coherent and Descriptions of Associations: Intact  Orientation:  Full (Time, Place, and Person)  Thought Content:  Paranoid Ideation  Suicidal Thoughts:  Yes.  without intent/plan  Homicidal Thoughts:  No  Memory:  Immediate;   Good Recent;   Good Remote;   Good  Judgement:  Impaired  Insight:  Shallow  Psychomotor Activity:  Increased  Concentration:  Concentration: Fair and Attention Span: Fair  Recall:  Fair  Fund of Knowledge:  Good  Language:  Good  Akathisia:  No  Handed:  Right  AIMS (  if indicated):     Assets:  Communication Skills Desire for  Improvement Physical Health  ADL's:  Intact  Cognition:  WNL  Sleep:  Number of Hours: 2.5      COGNITIVE FEATURES THAT CONTRIBUTE TO RISK:  Closed-mindedness and Polarized thinking    SUICIDE RISK:   Mild:  Suicidal ideation of limited frequency, intensity, duration, and specificity.  There are no identifiable plans, no associated intent, mild dysphoria and related symptoms, good self-control (both objective and subjective assessment), few other risk factors, and identifiable protective factors, including available and accessible social support.   PLAN OF CARE: 1. Admit for crisis management and stabilization. 2. Medication management to reduce current symptoms to base line and improve the     patient's overall level of functioning 3. Treat health problems as indicated. 4. Develop treatment plan to decrease risk of relapse upon discharge and the need for     readmission. 5. Psycho-social education regarding relapse prevention and self care. 6. Health care follow up as needed for medical problems. 7. Restart home medications where appropriate.   I certify that inpatient services furnished can reasonably be expected to improve the patient's condition.  Thedore Mins, MD 05/26/2016, 1:45 PM

## 2016-05-26 NOTE — [facility] Group Notes (Signed)
[facility] Group Notes:  (Clinical Social Work)   08/27/2015     11:15AM-12:00PM  Summary of Progress/Problems:   In today's process group, patients listed one healthy and one unhealthy coping technique they utilize and there was a full discussion about more healthy ways to cope with problems and symptoms, and how to stay well out of the hospital.  Later, a list of 99 coping skills was used to discuss more ideas that they generally had not previously considered.   The patient expressed that the healthy and unhealthy coping he often uses are writing down his feelings and isolating/ruminating.  He participated very heavily and in a healthy manner throughout group.  Type of Therapy:  Group Therapy - Process   Participation Level:  Active  Participation Quality:  Attentive and Sharing  Affect:  Blunted  Cognitive:  appropriate  Insight:  Appropriate  Engagement in Therapy:  Engaged  Modes of Intervention:  Education, Motivational Interviewing  Ambrose Mantle, LCSW 05/26/2016, 4:03 PM

## 2016-05-27 DIAGNOSIS — F323 Major depressive disorder, single episode, severe with psychotic features: Secondary | ICD-10-CM
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
## 2016-05-27 NOTE — Plan of Care (Signed)
Problem: Activity: Goal: Interest or engagement in activities will improve Outcome: Progressing Pt has attended groups this weekend   

## 2016-05-27 NOTE — Progress Notes (Signed)
D. Pt pleasant on approach, denies complaints at this time.  Pt looking forward to discharge to [facility] tomorrow, he has a bed there.  Pt denies SI/HI/hallucinations at this time.  Pt was positive for evening wrap up group, returned to room shortly after.  Pt has been pleasant and appropriate on unit.  A.  Support and encouragement offered, medication given as ordered.  R. Pt remains safe on the unit, will continue to monitor.

## 2016-05-27 NOTE — Progress Notes (Signed)
Adult Psychoeducational Group Note  Date:  05/27/2016 Time:  9:13 PM  Group Topic/Focus:  Wrap-Up Group:   The focus of this group is to help patients review their daily goal of treatment and discuss progress on daily workbooks.   Participation Level:  Active  Participation Quality:  Appropriate and Attentive  Affect:  Appropriate  Cognitive:  Appropriate  Insight: Appropriate  Engagement in Group:  Engaged  Modes of Intervention:  Discussion  Additional Comments:  Pt stated he had a good day today. Pt stated the music therapy group gave him peace of mind. Pt was encouraged to make his needs known to staff.  Caswell Corwin 05/27/2016, 9:13 PM

## 2016-05-27 NOTE — Progress Notes (Signed)
Patient ID: Brandon Glass, male   DOB: 1981/06/01, 35 y.o.   MRN: [id number]   D: Pt has been appropriate on the unit today. Pt attended all groups and engaged in treatment. Pt reported that his depression was a 4, his hopelessness was a 4, and his anxiety was a 2. Pt reported that his goal for today was to have a positive attitude. Pt reported being negative SI/HI, no AH/VH noted. A: 15 min checks continued for patient safety. R: Pt safety maintained.

## 2016-05-27 NOTE — [facility] Group Notes (Signed)
[facility] Group Notes:  (Clinical Social Work)  05/27/2016  11:00AM-12:00PM  Summary of Progress/Problems:  The main focus of today's process group was to listen to a variety of genres of music and to identify that different types of music provoke different responses.  The patient then was able to identify personally what was soothing for them, as well as energizing, as well as how patient can personally use this knowledge in sleep habits, with depression, and with other symptoms.  The patient expressed at the beginning of group the overall feeling of "good" and he enjoyed the music a great deal, interacted with it and with others throughout group.  Type of Therapy:  Music Therapy   Participation Level:  Active  Participation Quality:  Attentive and Sharing  Affect:  Appropriate  Cognitive:  Oriented  Insight:  Engaged  Engagement in Therapy:  Engaged  Modes of Intervention:   Activity, Exploration  Ambrose Mantle, LCSW 05/27/2016

## 2016-05-27 NOTE — Progress Notes (Signed)
[facility] MD Progress Note  05/27/2016 2:41 PM Brandon Glass  MRN:  [id number] Subjective: "My current medications are working well, I am ready for discharge, I got a bed in [facility] for Monday.''  Objective:  Patient is a 35 year old man with history of PTSD, substance abuse who was admitted due to psychosis, delusional thinking and suicidal thoughts with plan to kill himself and his girlfriend. Today, patient denies mood swings, violent tought, depression, psychosis or delusional thinking and no longer feel possessed by demons. Patient is requesting to be discharged tomorrow because he has secured a bed at [facility] for drug rehabilitation and should be their at noon tomorrow.  Principal Problem: MDD (major depressive disorder) (HCC) Diagnosis:   Patient Active Problem List   Diagnosis Date Noted  . Psychosis [F29] 05/25/2016    Priority: High  . PTSD (post-traumatic stress disorder) [F43.10] 04/25/2015    Priority: High  . MDD (major depressive disorder) (HCC) [F32.9] 05/26/2016  . Severe recurrent major depression without psychotic features (HCC) [F33.2] 04/25/2015  . Substance abuse [F19.10] 04/24/2015  . Polysubstance dependence (HCC) [F19.20]   . Alcohol use disorder, moderate, dependence (HCC) [F10.20]   . Cocaine use disorder, moderate, dependence (HCC) [F14.20]   . Polysubstance dependence, non-opioid, continuous (HCC) [F19.20] 02/20/2013    Class: Chronic  . Alcohol dependence (HCC) [F10.20] 02/20/2013  . Cocaine dependence (HCC) [F14.20] 02/20/2013  . Substance induced mood disorder (HCC) [F19.94] 02/20/2013   Total Time spent with patient: 30 minutes  Past Psychiatric History: as above  Past Medical History:  Past Medical History:  Diagnosis Date  . Anxiety   . Depression   . Substance abuse     Past Surgical History:  Procedure Laterality Date  . arm surgery    . LEG SURGERY     Family History: History reviewed. No pertinent family history. Family Psychiatric   History: Social History:  History  Alcohol Use  . Yes    Comment: drinks daily "all day"     History  Drug Use  . Types: Cocaine, Marijuana, MDMA (Ecstacy)    Comment: daily use    Social History   Social History  . Marital status: Single    Spouse name: N/A  . Number of children: N/A  . Years of education: N/A   Social History Main Topics  . Smoking status: Current Every Day Smoker    Packs/day: 1.00    Years: 16.00    Types: Cigarettes  . Smokeless tobacco: Never Used  . Alcohol use Yes     Comment: drinks daily "all day"  . Drug use:     Types: Cocaine, Marijuana, MDMA (Ecstacy)     Comment: daily use  . Sexual activity: Yes    Partners: Female    Birth control/ protection: Condom   Other Topics Concern  . None   Social History Narrative  . None   Additional Social History:                         Sleep: Good  Appetite:  Good  Current Medications: Current Facility-Administered Medications  Medication Dose Route Frequency Provider Last Rate Last Dose  . acetaminophen (TYLENOL) tablet 650 mg  650 mg Oral Q6H PRN Jomarie Longs, MD      . alum & mag hydroxide-simeth (MAALOX/MYLANTA) 200-200-20 MG/5ML suspension 30 mL  30 mL Oral Q4H PRN Saramma Eappen, MD      . amantadine (SYMMETREL) capsule 100 mg  100 mg Oral BID Thedore Mins, MD   100 mg at 05/27/16 0815  . fluPHENAZine (PROLIXIN) tablet 5 mg  5 mg Oral BID PC Keiston Manley, MD   5 mg at 05/27/16 0815  . gabapentin (NEURONTIN) capsule 300 mg  300 mg Oral BID Thedore Mins, MD   300 mg at 05/27/16 0815  . magnesium hydroxide (MILK OF MAGNESIA) suspension 30 mL  30 mL Oral Daily PRN Saramma Eappen, MD      . nicotine (NICODERM CQ - dosed in mg/24 hours) patch 21 mg  21 mg Transdermal Daily Jomarie Longs, MD   21 mg at 05/27/16 0815  . traZODone (DESYREL) tablet 100 mg  100 mg Oral QHS PRN Thedore Mins, MD   100 mg at 05/26/16 2059    Lab Results: No results found for this or any  previous visit (from the past 48 hour(s)).  Blood Alcohol level:  Lab Results  Component Value Date   ETH <5 05/24/2016   ETH <5 05/19/2016    Metabolic Disorder Labs: No results found for: HGBA1C, MPG No results found for: PROLACTIN No results found for: CHOL, TRIG, HDL, CHOLHDL, VLDL, LDLCALC  Physical Findings: AIMS: Facial and Oral Movements Muscles of Facial Expression: None, normal Lips and Perioral Area: None, normal Jaw: None, normal Tongue: None, normal,Extremity Movements Upper (arms, wrists, hands, fingers): None, normal Lower (legs, knees, ankles, toes): None, normal, Trunk Movements Neck, shoulders, hips: None, normal, Overall Severity Severity of abnormal movements (highest score from questions above): None, normal Incapacitation due to abnormal movements: None, normal Patient's awareness of abnormal movements (rate only patient's report): No Awareness, Dental Status Current problems with teeth and/or dentures?: No Does patient usually wear dentures?: No  CIWA:  CIWA-Ar Total: 1 COWS:     Musculoskeletal: Strength & Muscle Tone: within normal limits Gait & Station: normal Patient leans: N/A  Psychiatric Specialty Exam: Physical Exam  Psychiatric: His speech is normal and behavior is normal. Judgment and thought content normal. His mood appears anxious. Cognition and memory are normal.    Review of Systems  Constitutional: Negative.   HENT: Negative.   Eyes: Negative.   Respiratory: Negative.   Cardiovascular: Negative.   Gastrointestinal: Negative.   Genitourinary: Negative.   Musculoskeletal: Negative.   Skin: Negative.   Neurological: Negative.   Endo/Heme/Allergies: Negative.   Psychiatric/Behavioral: Positive for substance abuse.    Blood pressure 120/78, pulse 95, temperature 98 F (36.7 C), temperature source Oral, resp. rate 18, height 6\' 4"  (1.93 m), weight 98.4 kg (217 lb).Body mass index is 26.41 kg/m.  General Appearance: Casual  Eye  Contact:  Good  Speech:  Clear and Coherent  Volume:  Normal  Mood:  Euthymic  Affect:  Appropriate  Thought Process:  Coherent and Descriptions of Associations: Intact  Orientation:  Full (Time, Place, and Person)  Thought Content:  Logical  Suicidal Thoughts:  No  Homicidal Thoughts:  No  Memory:  Immediate;   Good Recent;   Good Remote;   Good  Judgement:  Fair  Insight:  Fair  Psychomotor Activity:  Normal  Concentration:  Concentration: Good and Attention Span: Good  Recall:  Good  Fund of Knowledge:  Good  Language:  Good  Akathisia:  No  Handed:  Right  AIMS (if indicated):     Assets:  Communication Skills Desire for Improvement  ADL's:  Intact  Cognition:  WNL  Sleep:  Number of Hours: 6.75     Treatment Plan Summary: 1. Admit  for crisis management and stabilization. 2. Medication management to reduce current symptoms to base line and improve the patient's overall level of functioning. -Continue Gabapentin  bid for agitation/cocaine -Continue Trazodone  qhs prn for insomnia -Continue Prolixin  bid for psychosis. -Continue Amantadine  bid for EPS prevention. 3. Treat health problems as indicated. 4. Develop treatment plan to decrease risk of relapse upon discharge and the need for readmission. 5. Psycho-social education regarding relapse prevention and self care. 6. Health care follow up as needed for medical problems. 7. Restart home medications where appropriate. 8. Patient is stable for discharge to [facility] tomorrow-05/28/16.   Thedore Mins, MD 05/27/2016, 2:41 PM

## 2016-05-27 NOTE — B[facility] Suicide Risk Assessment (Signed)
[facility] INPATIENT:  Family/Significant Other Suicide Prevention Education  Suicide Prevention Education:  Patient Refusal for Family/Significant Other Suicide Prevention Education: The patient Brandon Glass has refused to provide written consent for family/significant other to be provided Family/Significant Other Suicide Prevention Education during admission and/or prior to discharge.  Physician notified.  Beverly Sessions 05/27/2016, 11:45 AM

## 2016-05-28 MED ORDER — AMANTADINE HCL 100 MG PO CAPS: 100.0000 mg | ORAL_CAPSULE | Freq: Two times a day (BID) | ORAL | 0 refills | Status: DC

## 2016-05-28 MED ORDER — TRAZODONE HCL 100 MG PO TABS: 100.0000 mg | ORAL_TABLET | Freq: Every evening | ORAL | 0 refills | Status: DC | PRN

## 2016-05-28 MED ORDER — NICOTINE 21 MG/24HR TD PT24: 21.0000 mg | MEDICATED_PATCH | Freq: Every day | TRANSDERMAL | 0 refills | Status: DC

## 2016-05-28 MED ORDER — GABAPENTIN 300 MG PO CAPS: 300.0000 mg | ORAL_CAPSULE | Freq: Two times a day (BID) | ORAL | 0 refills | Status: DC

## 2016-05-28 MED ORDER — FLUPHENAZINE HCL 5 MG PO TABS: 5.0000 mg | ORAL_TABLET | Freq: Two times a day (BID) | ORAL | 0 refills | Status: DC

## 2016-05-28 NOTE — Progress Notes (Signed)
Pt up this morning with no complaints, looking forward to discharge today.  Slept well.

## 2016-05-28 NOTE — B[facility] Suicide Risk Assessment (Addendum)
[facility] Discharge Suicide Risk Assessment   Principal Problem: Severe single current episode of major depressive disorder, with psychotic features [facility]) Discharge Diagnoses:  Patient Active Problem List   Diagnosis Date Noted  . Severe single current episode of major depressive disorder, with psychotic features (HCC) [F32.3]     Priority: High  . MDD (major depressive disorder) (HCC) [F32.9] 05/26/2016  . Psychosis [F29] 05/25/2016  . Severe recurrent major depression without psychotic features (HCC) [F33.2] 04/25/2015  . PTSD (post-traumatic stress disorder) [F43.10] 04/25/2015  . Substance abuse [F19.10] 04/24/2015  . Polysubstance dependence (HCC) [F19.20]   . Alcohol use disorder, moderate, dependence (HCC) [F10.20]   . Cocaine use disorder, moderate, dependence (HCC) [F14.20]   . Polysubstance dependence, non-opioid, continuous (HCC) [F19.20] 02/20/2013    Class: Chronic  . Alcohol dependence (HCC) [F10.20] 02/20/2013  . Cocaine dependence (HCC) [F14.20] 02/20/2013  . Substance induced mood disorder (HCC) [F19.94] 02/20/2013    Total Time spent with patient: 30 minutes  Musculoskeletal: Strength & Muscle Tone: within normal limits Gait & Station: normal Patient leans: N/A  Psychiatric Specialty Exam: Review of Systems  Neurological: Negative for speech change.  Psychiatric/Behavioral: Positive for substance abuse. Negative for depression.  All other systems reviewed and are negative.   Blood pressure (!) 141/73, pulse 69, temperature 97.3 F (36.3 C), temperature source Oral, resp. rate 18, height 6\' 4"  (1.93 m), weight 98.4 kg (217 lb).Body mass index is 26.41 kg/m.  General Appearance: Casual  Eye Contact::  Fair  Speech:  Clear and Coherent409  Volume:  Normal  Mood:  Euthymic  Affect:  Appropriate  Thought Process:  Goal Directed and Descriptions of Associations: Intact  Orientation:  Full (Time, Place, and Person)  Thought Content:  Logical  Suicidal Thoughts:   No  Homicidal Thoughts:  No  Memory:  Immediate;   Fair Recent;   Fair Remote;   Fair  Judgement:  Fair  Insight:  Fair  Psychomotor Activity:  Normal  Concentration:  Fair  Recall:  [employer] of Knowledge:Fair  Language: Fair  Akathisia:  No  Handed:  Right  AIMS (if indicated):     Assets:  Desire for Improvement  Sleep:  Number of Hours: 6.5  Cognition: WNL  ADL's:  Intact   Mental Status Per Nursing Assessment::   On Admission:     Demographic Factors:  Male  Loss Factors: NA  Historical Factors: Impulsivity  Risk Reduction Factors:   Positive social support  Continued Clinical Symptoms:  Alcohol/Substance Abuse/Dependencies  Cognitive Features That Contribute To Risk:  None    Suicide Risk:  Minimal: No identifiable suicidal ideation.  Patients presenting with no risk factors but with morbid ruminations; may be classified as minimal risk based on the severity of the depressive symptoms  Follow-up Information    [facility]. Go on 05/28/2016.   Why:  Patient has appt at 54 Noon at this facility for substance abuse treatment. Please bring hospital discharge paperwork.   Contact information: [address] [place]  [address] Phone:  [phone]  Fax:  [phone]          Plan Of Care/Follow-up recommendations:  Activity:  no restrictions Diet:  regular Tests:  please get Hba1c, lipid panel on an out patient basis Other:  patient to follow up with out patient psychiatrist for restarting an antidepressant  Dodie Parisi, MD 05/28/2016, 9:51 AM

## 2016-05-28 NOTE — Discharge Summary (Signed)
Physician Discharge Summary Note  Patient:  Brandon Glass is an 35 y.o., male MRN:  [id number] DOB:  03/16/1981 Patient phone:  [phone] (home)  Patient address:   [address] [place] [address],  Total Time spent with patient: 30 minutes  Date of Admission:  05/26/2016 Date of Discharge: 05/28/2016  Reason for Admission:  Patient had thoughts of being possessed by a demon  Principal Problem: Severe single current episode of major depressive disorder, with psychotic features [facility]) Discharge Diagnoses: Patient Active Problem List   Diagnosis Date Noted  . Severe single current episode of major depressive disorder, with psychotic features (HCC) [F32.3]   . PTSD (post-traumatic stress disorder) [F43.10] 04/25/2015  . Alcohol use disorder, moderate, dependence (HCC) [F10.20]   . Cocaine use disorder, moderate, dependence (HCC) [F14.20]   . Polysubstance dependence, non-opioid, continuous (HCC) [F19.20] 02/20/2013    Class: Chronic    Past Psychiatric History: see HPI  Past Medical History:  Past Medical History:  Diagnosis Date  . Anxiety   . Depression   . Substance abuse     Past Surgical History:  Procedure Laterality Date  . arm surgery    . LEG SURGERY     Family History: History reviewed. No pertinent family history. Family Psychiatric  History: see HPI Social History:  History  Alcohol Use  . Yes    Comment: drinks daily "all day"     History  Drug Use  . Types: Cocaine, Marijuana, MDMA (Ecstacy)    Comment: daily use    Social History   Social History  . Marital status: Single    Spouse name: N/A  . Number of children: N/A  . Years of education: N/A   Social History Main Topics  . Smoking status: Current Every Day Smoker    Packs/day: 1.00    Years: 16.00    Types: Cigarettes  . Smokeless tobacco: Never Used  . Alcohol use Yes     Comment: drinks daily "all day"  . Drug use:     Types: Cocaine, Marijuana, MDMA (Ecstacy)      Comment: daily use  . Sexual activity: Yes    Partners: Female    Birth control/ protection: Condom   Other Topics Concern  . None   Social History Narrative  . None    Hospital Course:  Brandon Glass, 35 year old man with history of PTSD, substance abuse who presents with suicidal thoughts with plan to kill himself and his girlfriend.  He stopped talking Geodon and  reported worsening depression and violent thoughts.    Brandon Glass was admitted for Severe single current episode of major depressive disorder, with psychotic features (HCC) and crisis management.  He was treated with fluPHENAZine (PROLIXIN) 5 MG tablet mood stabilization/psychosis, gabapentin (NEURONTIN) 300 MG  anxiety, traZODone (DESYREL) 100 MG for insomnia.  Medical problems were identified and treated as needed.  Home medications were restarted as appropriate.  Improvement was monitored by observation and Brandon Glass daily report of symptom reduction.  Emotional and mental status was monitored by daily self inventory reports completed by Brandon Glass and clinical staff.  Patient reported continued improvement, denied any new concerns.  Patient had been compliant on medications and denied side effects.  Support and encouragement was provided.    Patient encouraged to attend groups to help with recognizing triggers of emotional crises and de-stabilizations.  Patient encouraged to attend group to help identify the positive things in life that  would help in dealing with feelings of loss, depression and unhealthy or abusive tendencies.         Brandon Glass was evaluated by the treatment team for stability and plans for continued recovery upon discharge.  He was offered further treatment options upon discharge including Residential, Intensive Outpatient and Outpatient treatment. He will follow up with agency listed below for medication management and counseling.  Encouraged patient to maintain satisfactory support  network and home environment.  Advised to adhere to medication compliance and outpatient treatment follow up.  Prescriptions provided.       DECKLYN HYDER motivation was an integral factor for scheduling further treatment.  Employment, transportation, bed availability, health status, family support, and any pending legal issues were also considered during his hospital stay.  Upon completion of this admission the patient was both mentally and medically stable for discharge denying suicidal/homicidal ideation, auditory/visual/tactile hallucinations, delusional thoughts and paranoia.       Associated Signs/Symptoms:  Physical Findings: AIMS: Facial and Oral Movements Muscles of Facial Expression: None, normal Lips and Perioral Area: None, normal Jaw: None, normal Tongue: None, normal,Extremity Movements Upper (arms, wrists, hands, fingers): None, normal Lower (legs, knees, ankles, toes): None, normal, Trunk Movements Neck, shoulders, hips: None, normal, Overall Severity Severity of abnormal movements (highest score from questions above): None, normal Incapacitation due to abnormal movements: None, normal Patient's awareness of abnormal movements (rate only patient's report): No Awareness, Dental Status Current problems with teeth and/or dentures?: No Does patient usually wear dentures?: No  CIWA:  CIWA-Ar Total: 1 COWS:     Musculoskeletal: Strength & Muscle Tone: within normal limits Gait & Station: normal Patient leans: N/A  Psychiatric Specialty Exam:  SEE MD SRA Physical Exam  Vitals reviewed. Psychiatric: He has a normal mood and affect. His speech is normal and behavior is normal. Judgment and thought content normal. Cognition and memory are normal.    Review of Systems  Constitutional: Negative.  Negative for fever.  Eyes: Negative.  Negative for blurred vision.  Respiratory: Negative.   Cardiovascular: Positive for chest pain.  Gastrointestinal: Negative.    Genitourinary: Negative.   Musculoskeletal: Negative.   Skin: Positive for rash.  Neurological: Positive for dizziness and headaches.  Endo/Heme/Allergies: Negative.   Psychiatric/Behavioral: Negative.     Blood pressure (!) 141/73, pulse 69, temperature 97.3 F (36.3 C), temperature source Oral, resp. rate 18, height  (1.93 m), weight 98.4 kg (217 lb).Body mass index is 26.41 kg/m.   Have you used any form of tobacco in the last 30 days? (Cigarettes, Smokeless Tobacco, Cigars, and/or Pipes): Yes  Has this patient used any form of tobacco in the last 30 days? (Cigarettes, Smokeless Tobacco, Cigars, and/or Pipes) Yes, Rx given  Blood Alcohol level:  Lab Results  Component Value Date   ETH <5 05/24/2016   ETH <5 05/19/2016    Metabolic Disorder Labs:  No results found for: HGBA1C, MPG No results found for: PROLACTIN No results found for: CHOL, TRIG, HDL, CHOLHDL, VLDL, LDLCALC  See Psychiatric Specialty Exam and Suicide Risk Assessment completed by Attending Physician prior to discharge.  Discharge destination:  Home  Is patient on multiple antipsychotic therapies at discharge:  No   Has Patient had three or more failed trials of antipsychotic monotherapy by history:  No  Recommended Plan for Multiple Antipsychotic Therapies: NA     Medication List    STOP taking these medications   ziprasidone 40 MG capsule Commonly known as:  GEODON  TAKE these medications     Indication  amantadine 100 MG capsule Commonly known as:  SYMMETREL Take 1 capsule (100 mg total) by mouth 2 (two) times daily.  Indication:  Extrapyramidal Reaction caused by Medications   fluPHENAZine 5 MG tablet Commonly known as:  PROLIXIN Take 1 tablet (5 mg total) by mouth 2 (two) times daily after a meal.  Indication:  Psychosis   gabapentin 300 MG capsule Commonly known as:  NEURONTIN Take 1 capsule (300 mg total) by mouth 2 (two) times daily.  Indication:  Aggressive Behavior,  Agitation, Cocaine Dependence, Trouble Sleeping   nicotine 21 mg/24hr patch Commonly known as:  NICODERM CQ - dosed in mg/24 hours Place 1 patch (21 mg total) onto the skin daily.  Indication:  Nicotine Addiction   traZODone 100 MG tablet Commonly known as:  DESYREL Take 1 tablet (100 mg total) by mouth at bedtime as needed for sleep. What changed:  when to take this  reasons to take this  Indication:  Trouble Sleeping      Follow-up Information    [facility]. Go on 05/28/2016.   Why:  Patient has appt at 68 Noon at this facility for substance abuse treatment. Please bring hospital discharge paperwork.   Contact information: [address] [place]  [address] Phone:  [phone]  Fax:  [phone]          Follow-up recommendations:  Activity:  as tol Diet:  as tol  Comments:  1.  Take all your medications as prescribed.   2.  Report any adverse side effects to outpatient provider. 3.  Patient instructed to not use alcohol or illegal drugs while on prescription medicines. 4.  In the event of worsening symptoms, instructed patient to call 911, the crisis hotline or go to nearest emergency room for evaluation of symptoms.  Signed: Lindwood Qua, NP [facility] 05/28/2016, 10:58 AM

## 2016-05-28 NOTE — Progress Notes (Signed)
Patient alert and appropriate, polite with staff and peers. Denied SI, HI, and AVH.  Received discharge order.  Reviewed paperwork and medications with patient, patient given scripts, paperwork, and sample meds.  Discharged to lobby [id number], to follow up with [facility].

## 2016-05-28 NOTE — Progress Notes (Addendum)
  [facility] Adult Case Management Discharge Plan :  Will you be returning to the same living situation after discharge:  No. At discharge, do you have transportation home?: Yes,  family will transport to treatment center Do you have the ability to pay for your medications: Yes,  referred to provider who can assist  Release of information consent forms completed and in the chart;  Patient's signature needed at discharge.  Patient to Follow up at: Follow-up Information    [facility]. Go on 05/28/2016.   Why:  Patient has appt at 69 Noon at this facility for substance abuse treatment. Please bring hospital discharge paperwork.   Contact information: [address] [place]  [address] Phone:  [phone]  Fax:  [phone]          Next level of care provider has access to [facility] Link:no  Safety Planning and Suicide Prevention discussed: Yes,  reviewed in group w patient, brochure provided  Have you used any form of tobacco in the last 30 days? (Cigarettes, Smokeless Tobacco, Cigars, and/or Pipes): Yes  Has patient been referred to the Quitline?: Yes, referral faxed 05/27/16  Patient has been referred for addiction treatment: Yes, [facility]  Sallee Lange 05/28/2016, 11:22 AM

## 2016-05-28 NOTE — Tx Team (Signed)
  Interdisciplinary Treatment Plan Update (Adult)  Date: 05/28/2016  Time Reviewed: 11:15 AM    Progress in Treatment: Attending groups: Yes. Participating in groups:  Yes. Taking medication as prescribed:  Yes. Tolerating medication:  Yes. Family/Significant othe contact made:   Patient understands diagnosis:  Yes. will admit for substance abuse tx today Discussing patient identified problems/goals with staff:  Yes. Medical problems stabilized or resolved:  Yes. Denies suicidal/homicidal ideation: Yes. Issues/concerns per patient self-inventory:  Other:  New problem(s) identified:  none  Discharge Plan or Barriers: will discharge today,   Reason for Continuation of Hospitalization:  At admission, pt presented with the following: Anxiety Depression Homicidal ideation Medication stabilization  Comments:    Estimated length of stay:  will discharge today for continued substance abuse treatment  New goal(s):   Additional Comments:  Patient and CSW reviewed pt's identified goals and treatment plan. Patient verbalized understanding and agreed to treatment plan. CSW reviewed [facility] "Discharge Process and Patient Involvement" Form. Pt verbalized understanding of information provided and signed form.    Review of initial/current patient goals per problem list:    1.  Goal(s): Patient will participate in aftercare plan  Met:    Target date: at discharge  As evidenced by: Patient will participate within aftercare plan AEB aftercare provider and housing plan at discharge being identified.  7/31:  Will admit to [facility] for continued treatment today.  Goal  met.  Edwyna Shell, LCSW 2.  Goal(s): Patient will demonstrate decreased signs of substance abuse  Target date: at discharge  As evidenced by: Patient will utilize self rating of anxiety at 3 or below and demonstrated decreased signs of anxiety, or be deemed stable for discharge by MD 7/31:  Pt displays no signs  of withdrawal, stable vital signs, COWS/CIWA of 0.  Will admit to residential facility for further treatment.   Goal met.  Edwyna Shell, LCSW  3.  Patient displays decreased signs of psychosis.  - Target date:  At discharge  - AEB:  Patient will report decreased AVH or will return to baseline by discharge.  - 7/31:  Patient presents with no signs/sx of AVH, deemed stable for discharge.  Goal met.  Edwyna Shell, LCSW  Attendees: Patient:   05/28/2016 11:12 AM   Family:   05/28/2016 11:12 AM   Physician:  Ursula Alert, MD 05/28/2016 11:12 AM   Nursing:   Vallarie Mare, RN 05/28/2016 11:12 AM   Clinical Social Worker: Edwyna Shell, LCSW 05/28/2016 11:12 AM   Clinical Social Worker:  05/28/2016 11:12 AM   Other:  Norberto Sorenson, [place] 05/28/2016 11:12 AM   Other:   05/28/2016 11:12 AM   Other:   05/28/2016 11:12 AM   Other:  05/28/2016 11:12 AM   Other:  05/28/2016 11:12 AM   Other:  05/28/2016 11:12 AM    05/28/2016 11:12 AM    05/28/2016 11:12 AM    05/28/2016 11:12 AM    05/28/2016 11:12 AM    Scribe for Treatment Team:   Edwyna Shell, LCSW 05/28/2016 11:12 AM

## 2016-06-02 ENCOUNTER — Encounter (HOSPITAL_BASED_OUTPATIENT_CLINIC_OR_DEPARTMENT_OTHER): Payer: Self-pay | Admitting: *Deleted

## 2016-06-02 ENCOUNTER — Emergency Department (HOSPITAL_BASED_OUTPATIENT_CLINIC_OR_DEPARTMENT_OTHER)
Admission: EM | Admit: 2016-06-02 | Discharge: 2016-06-02 | Disposition: A | Discharge status: Home / Self Care | Payer: Self-pay | Attending: Dermatology | Admitting: Dermatology

## 2016-06-02 DIAGNOSIS — Z5321 Procedure and treatment not carried out due to patient leaving prior to being seen by health care provider: Secondary | ICD-10-CM | POA: Insufficient documentation

## 2016-06-02 DIAGNOSIS — R002 Palpitations: Secondary | ICD-10-CM | POA: Insufficient documentation

## 2016-06-02 NOTE — ED Notes (Addendum)
Pt called for vitals recheck. Pt not found in waiting room at this time.

## 2016-06-02 NOTE — ED Triage Notes (Signed)
Pt states he has been having palpitations over the past week that have become worse "back to back, often". Reports sharp pain in his chest with lightheadedness around 30 minutes PTA. PT arrives from [facility] facility where he is there for crack cocaine and ETOH recovery. No drugs/etoh for two weeks.

## 2016-06-02 NOTE — ED Notes (Signed)
PT CALLED X2 IN WAITING ROOM. NO ANSWER.

## 2017-07-16 ENCOUNTER — Emergency Department (HOSPITAL_COMMUNITY): Payer: Self-pay

## 2017-07-16 ENCOUNTER — Emergency Department (HOSPITAL_COMMUNITY)
Admission: EM | Admit: 2017-07-16 | Discharge: 2017-07-16 | Disposition: A | Discharge status: Home / Self Care | Payer: Self-pay | Attending: Emergency Medicine | Admitting: Emergency Medicine

## 2017-07-16 ENCOUNTER — Encounter (HOSPITAL_COMMUNITY): Payer: Self-pay | Admitting: Emergency Medicine

## 2017-07-16 DIAGNOSIS — Y998 Other external cause status: Secondary | ICD-10-CM | POA: Insufficient documentation

## 2017-07-16 DIAGNOSIS — Y92009 Unspecified place in unspecified non-institutional (private) residence as the place of occurrence of the external cause: Secondary | ICD-10-CM | POA: Insufficient documentation

## 2017-07-16 DIAGNOSIS — F17210 Nicotine dependence, cigarettes, uncomplicated: Secondary | ICD-10-CM | POA: Insufficient documentation

## 2017-07-16 DIAGNOSIS — F1910 Other psychoactive substance abuse, uncomplicated: Secondary | ICD-10-CM

## 2017-07-16 DIAGNOSIS — Y939 Activity, unspecified: Secondary | ICD-10-CM | POA: Insufficient documentation

## 2017-07-16 DIAGNOSIS — F1410 Cocaine abuse, uncomplicated: Secondary | ICD-10-CM | POA: Insufficient documentation

## 2017-07-16 DIAGNOSIS — Z79899 Other long term (current) drug therapy: Secondary | ICD-10-CM | POA: Insufficient documentation

## 2017-07-16 DIAGNOSIS — R45850 Homicidal ideations: Secondary | ICD-10-CM | POA: Insufficient documentation

## 2017-07-16 DIAGNOSIS — S0990XA Unspecified injury of head, initial encounter: Secondary | ICD-10-CM | POA: Insufficient documentation

## 2017-07-16 DIAGNOSIS — S0033XA Contusion of nose, initial encounter: Secondary | ICD-10-CM | POA: Insufficient documentation

## 2017-07-16 LAB — COMPREHENSIVE METABOLIC PANEL
ALT: 14 U/L — AB (ref 17–63)
AST: 19 U/L (ref 15–41)
Albumin: 3.7 g/dL (ref 3.5–5.0)
Alkaline Phosphatase: 41 U/L (ref 38–126)
Anion gap: 9 (ref 5–15)
BUN: 6 mg/dL (ref 6–20)
CHLORIDE: 107 mmol/L (ref 101–111)
CO2: 23 mmol/L (ref 22–32)
CREATININE: 0.96 mg/dL (ref 0.61–1.24)
Calcium: 8.6 mg/dL — ABNORMAL LOW (ref 8.9–10.3)
GFR calc Af Amer: >60 mL/min (ref >60)
GLUCOSE: 92 mg/dL (ref 65–99)
Potassium: 3 mmol/L — ABNORMAL LOW (ref 3.5–5.1)
SODIUM: 139 mmol/L (ref 135–145)
Total Bilirubin: 0.6 mg/dL (ref 0.3–1.2)
Total Protein: 6.3 g/dL — ABNORMAL LOW (ref 6.5–8.1)

## 2017-07-16 LAB — RAPID URINE DRUG SCREEN, HOSP PERFORMED
AMPHETAMINES: NOT DETECTED
BARBITURATES: NOT DETECTED
Benzodiazepines: POSITIVE — AB
Cocaine: POSITIVE — AB
Opiates: NOT DETECTED
TETRAHYDROCANNABINOL: POSITIVE — AB

## 2017-07-16 LAB — CBC WITH DIFFERENTIAL/PLATELET
BASOS ABS: 0 10*3/uL (ref 0.0–0.1)
Basophils Relative: 0 %
EOS PCT: 2 %
Eosinophils Absolute: 0.1 10*3/uL (ref 0.0–0.7)
HCT: 35.2 % — ABNORMAL LOW (ref 39.0–52.0)
Hemoglobin: 12.2 g/dL — ABNORMAL LOW (ref 13.0–17.0)
LYMPHS PCT: 49 %
Lymphs Abs: 3.5 10*3/uL (ref 0.7–4.0)
MCH: 31.6 pg (ref 26.0–34.0)
MCHC: 34.7 g/dL (ref 30.0–36.0)
MCV: 91.2 fL (ref 78.0–100.0)
Monocytes Absolute: 0.4 10*3/uL (ref 0.1–1.0)
Monocytes Relative: 6 %
Neutro Abs: 3.1 10*3/uL (ref 1.7–7.7)
Neutrophils Relative %: 43 %
PLATELETS: 289 10*3/uL (ref 150–400)
RBC: 3.86 MIL/uL — AB (ref 4.22–5.81)
RDW: 15.3 % (ref 11.5–15.5)
WBC: 7.2 10*3/uL (ref 4.0–10.5)

## 2017-07-16 LAB — ETHANOL: Alcohol, Ethyl (B): 53 mg/dL — ABNORMAL HIGH (ref <5)

## 2017-07-16 MED ORDER — VITAMIN B-1 100 MG PO TABS
100.0000 mg | ORAL_TABLET | Freq: Every day | ORAL | Status: DC
  Administered 2017-07-16: 100 mg via ORAL
  Filled 2017-07-16: qty 1

## 2017-07-16 MED ORDER — LORAZEPAM 2 MG/ML IJ SOLN: 0.0000 mg | Freq: Four times a day (QID) | INTRAMUSCULAR | Status: DC

## 2017-07-16 MED ORDER — LORAZEPAM 1 MG PO TABS: 0.0000 mg | ORAL_TABLET | Freq: Four times a day (QID) | ORAL | Status: DC

## 2017-07-16 MED ORDER — POTASSIUM CHLORIDE CRYS ER 20 MEQ PO TBCR
40.0000 meq | EXTENDED_RELEASE_TABLET | Freq: Once | ORAL | Status: AC
  Administered 2017-07-16: 40 meq via ORAL
  Filled 2017-07-16: qty 2

## 2017-07-16 MED ORDER — LORAZEPAM 1 MG PO TABS: 0.0000 mg | ORAL_TABLET | Freq: Two times a day (BID) | ORAL | Status: DC

## 2017-07-16 MED ORDER — LORAZEPAM 2 MG/ML IJ SOLN: 0.0000 mg | Freq: Two times a day (BID) | INTRAMUSCULAR | Status: DC

## 2017-07-16 MED ORDER — NICOTINE 21 MG/24HR TD PT24: 21.0000 mg | MEDICATED_PATCH | Freq: Every day | TRANSDERMAL | Status: DC

## 2017-07-16 MED ORDER — THIAMINE HCL 100 MG/ML IJ SOLN: 100.0000 mg | Freq: Every day | INTRAMUSCULAR | Status: DC

## 2017-07-16 NOTE — ED Notes (Addendum)
Pt verbalizes understanding of discharge paperwork and follow-up with [facility].  Pt ambulatory with steady gait.  Pt states his brother will be picking him up from ED.

## 2017-07-16 NOTE — ED Notes (Signed)
Returned from CT.

## 2017-07-16 NOTE — ED Notes (Signed)
[facility] papers have been served by Emergency planning/management officer.
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
## 2017-07-16 NOTE — ED Notes (Signed)
Pt is resting calmly at this time and very drowsy will plan TTS for when pt is sober and wake.
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
## 2017-07-16 NOTE — ED Notes (Signed)
Original IVC paperwork in pod F med room. Three copies on clipboard.

## 2017-07-16 NOTE — Progress Notes (Signed)
Per Ferne Reus, NP, the patient does not meet criteria for inpatient treatment. The patient is recommended for discharge and to follow up with [facility].    Daisy Lazar, RN notified.    Baldo Daub MSW, LCSWA CSW Disposition [phone]

## 2017-07-16 NOTE — Discharge Instructions (Signed)
Follow up with your Psych team
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
## 2019-01-03 ENCOUNTER — Emergency Department (HOSPITAL_COMMUNITY): Payer: Medicaid Other

## 2019-01-03 ENCOUNTER — Inpatient Hospital Stay (HOSPITAL_COMMUNITY)
Admission: EM | Admit: 2019-01-03 | Discharge: 2019-02-14 | DRG: 963 | Disposition: A | Discharge status: Home / Self Care | Payer: Medicaid Other | Attending: General Surgery | Admitting: General Surgery

## 2019-01-03 ENCOUNTER — Encounter (HOSPITAL_COMMUNITY): Payer: Self-pay

## 2019-01-03 ENCOUNTER — Other Ambulatory Visit: Payer: Self-pay

## 2019-01-03 DIAGNOSIS — I10 Essential (primary) hypertension: Secondary | ICD-10-CM | POA: Diagnosis present

## 2019-01-03 DIAGNOSIS — D62 Acute posthemorrhagic anemia: Secondary | ICD-10-CM | POA: Diagnosis present

## 2019-01-03 DIAGNOSIS — S32029A Unspecified fracture of second lumbar vertebra, initial encounter for closed fracture: Secondary | ICD-10-CM | POA: Diagnosis present

## 2019-01-03 DIAGNOSIS — F411 Generalized anxiety disorder: Secondary | ICD-10-CM | POA: Diagnosis present

## 2019-01-03 DIAGNOSIS — S24104A Unspecified injury at T11-T12 level of thoracic spinal cord, initial encounter: Secondary | ICD-10-CM | POA: Diagnosis present

## 2019-01-03 DIAGNOSIS — F32A Depression, unspecified: Secondary | ICD-10-CM

## 2019-01-03 DIAGNOSIS — M545 Low back pain, unspecified: Secondary | ICD-10-CM

## 2019-01-03 DIAGNOSIS — F1910 Other psychoactive substance abuse, uncomplicated: Secondary | ICD-10-CM

## 2019-01-03 DIAGNOSIS — S31139A Puncture wound of abdominal wall without foreign body, unspecified quadrant without penetration into peritoneal cavity, initial encounter: Principal | ICD-10-CM | POA: Diagnosis present

## 2019-01-03 DIAGNOSIS — F329 Major depressive disorder, single episode, unspecified: Secondary | ICD-10-CM | POA: Diagnosis present

## 2019-01-03 DIAGNOSIS — R310 Gross hematuria: Secondary | ICD-10-CM | POA: Diagnosis present

## 2019-01-03 DIAGNOSIS — K661 Hemoperitoneum: Secondary | ICD-10-CM | POA: Diagnosis present

## 2019-01-03 DIAGNOSIS — F17200 Nicotine dependence, unspecified, uncomplicated: Secondary | ICD-10-CM | POA: Diagnosis present

## 2019-01-03 DIAGNOSIS — I959 Hypotension, unspecified: Secondary | ICD-10-CM | POA: Diagnosis present

## 2019-01-03 DIAGNOSIS — W3400XA Accidental discharge from unspecified firearms or gun, initial encounter: Secondary | ICD-10-CM

## 2019-01-03 DIAGNOSIS — W19XXXA Unspecified fall, initial encounter: Secondary | ICD-10-CM

## 2019-01-03 DIAGNOSIS — S37032A Laceration of left kidney, unspecified degree, initial encounter: Secondary | ICD-10-CM | POA: Diagnosis present

## 2019-01-03 DIAGNOSIS — S36522A Contusion of descending [left] colon, initial encounter: Secondary | ICD-10-CM | POA: Diagnosis present

## 2019-01-03 DIAGNOSIS — S32019A Unspecified fracture of first lumbar vertebra, initial encounter for closed fracture: Secondary | ICD-10-CM | POA: Diagnosis present

## 2019-01-03 DIAGNOSIS — T07XXXA Unspecified multiple injuries, initial encounter: Secondary | ICD-10-CM

## 2019-01-03 HISTORY — DX: Other psychoactive substance abuse, uncomplicated: F19.10

## 2019-01-03 LAB — CBC
HEMATOCRIT: 44 % (ref 39.0–52.0)
HEMOGLOBIN: 14.1 g/dL (ref 13.0–17.0)
MCH: 31.3 pg (ref 26.0–34.0)
MCHC: 32 g/dL (ref 30.0–36.0)
MCV: 97.6 fL (ref 80.0–100.0)
Platelets: 285 10*3/uL (ref 150–400)
RBC: 4.51 MIL/uL (ref 4.22–5.81)
RDW: 13.6 % (ref 11.5–15.5)
WBC: 10.8 10*3/uL — ABNORMAL HIGH (ref 4.0–10.5)
nRBC: 0 % (ref 0.0–0.2)

## 2019-01-03 LAB — PROTIME-INR
INR: 1.2 (ref 0.8–1.2)
Prothrombin Time: 15.2 seconds (ref 11.4–15.2)

## 2019-01-03 LAB — COMPREHENSIVE METABOLIC PANEL
ALT: 21 U/L (ref 0–44)
AST: 48 U/L — ABNORMAL HIGH (ref 15–41)
Albumin: 4.6 g/dL (ref 3.5–5.0)
Alkaline Phosphatase: 58 U/L (ref 38–126)
Anion gap: 21 — ABNORMAL HIGH (ref 5–15)
BUN: 12 mg/dL (ref 6–20)
CO2: 15 mmol/L — ABNORMAL LOW (ref 22–32)
Calcium: 8.8 mg/dL — ABNORMAL LOW (ref 8.9–10.3)
Chloride: 103 mmol/L (ref 98–111)
Creatinine, Ser: 1.6 mg/dL — ABNORMAL HIGH (ref 0.61–1.24)
GFR calc non Af Amer: 55 mL/min — ABNORMAL LOW (ref >60)
Glucose, Bld: 109 mg/dL — ABNORMAL HIGH (ref 70–99)
Potassium: 3.8 mmol/L (ref 3.5–5.1)
Sodium: 139 mmol/L (ref 135–145)
Total Bilirubin: 1 mg/dL (ref 0.3–1.2)
Total Protein: 7.8 g/dL (ref 6.5–8.1)

## 2019-01-03 LAB — ETHANOL: Alcohol, Ethyl (B): 274 mg/dL — ABNORMAL HIGH (ref <10)

## 2019-01-03 LAB — LACTIC ACID, PLASMA: Lactic Acid, Venous: >11 mmol/L (ref 0.5–1.9)

## 2019-01-03 LAB — CDS SEROLOGY

## 2019-01-03 MED ORDER — IOHEXOL 300 MG/ML  SOLN
100.0000 mL | Freq: Once | INTRAMUSCULAR | Status: AC | PRN
  Administered 2019-01-03: 100 mL via INTRAVENOUS

## 2019-01-03 MED ORDER — ONDANSETRON HCL 4 MG/2ML IJ SOLN
4.0000 mg | Freq: Four times a day (QID) | INTRAMUSCULAR | Status: DC | PRN
  Administered 2019-01-03 – 2019-01-25 (×2): 4 mg via INTRAVENOUS
  Filled 2019-01-03 (×2): qty 2

## 2019-01-03 MED ORDER — HYDRALAZINE HCL 20 MG/ML IJ SOLN: 10.0000 mg | INTRAMUSCULAR | Status: DC | PRN

## 2019-01-03 MED ORDER — ACETAMINOPHEN 325 MG PO TABS: 650.0000 mg | ORAL_TABLET | ORAL | Status: DC | PRN

## 2019-01-03 MED ORDER — DEXTROSE-NACL 5-0.9 % IV SOLN
INTRAVENOUS | Status: DC
  Administered 2019-01-03 – 2019-01-07 (×5): via INTRAVENOUS

## 2019-01-03 MED ORDER — SODIUM CHLORIDE 0.9 % IV SOLN
INTRAVENOUS | Status: AC | PRN
  Administered 2019-01-03: 1000 mL via INTRAVENOUS

## 2019-01-03 MED ORDER — ONDANSETRON 4 MG PO TBDP
4.0000 mg | ORAL_TABLET | Freq: Four times a day (QID) | ORAL | Status: DC | PRN
  Filled 2019-01-03: qty 1

## 2019-01-03 MED ORDER — HYDROMORPHONE HCL 1 MG/ML IJ SOLN
1.0000 mg | INTRAMUSCULAR | Status: DC | PRN
  Administered 2019-01-03 – 2019-01-04 (×6): 1 mg via INTRAVENOUS
  Filled 2019-01-03 (×6): qty 1
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
## 2019-01-03 NOTE — ED Notes (Signed)
To CT

## 2019-01-03 NOTE — ED Notes (Signed)
Trauma audit performed by this nurse
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
## 2019-01-04 LAB — COMPREHENSIVE METABOLIC PANEL
ALT: 30 U/L (ref 0–44)
AST: 68 U/L — AB (ref 15–41)
Albumin: 4.5 g/dL (ref 3.5–5.0)
Alkaline Phosphatase: 56 U/L (ref 38–126)
Anion gap: 14 (ref 5–15)
BUN: 13 mg/dL (ref 6–20)
CALCIUM: 8.5 mg/dL — AB (ref 8.9–10.3)
CO2: 18 mmol/L — ABNORMAL LOW (ref 22–32)
CREATININE: 1.25 mg/dL — AB (ref 0.61–1.24)
Chloride: 106 mmol/L (ref 98–111)
GFR calc Af Amer: >60 mL/min (ref >60)
GFR calc non Af Amer: >60 mL/min (ref >60)
Glucose, Bld: 168 mg/dL — ABNORMAL HIGH (ref 70–99)
Potassium: 4.3 mmol/L (ref 3.5–5.1)
Sodium: 138 mmol/L (ref 135–145)
Total Bilirubin: 1.3 mg/dL — ABNORMAL HIGH (ref 0.3–1.2)
Total Protein: 7.8 g/dL (ref 6.5–8.1)

## 2019-01-04 LAB — TYPE AND SCREEN
ABO/RH(D): B POS
Antibody Screen: NEGATIVE
Unit division: 0
Unit division: 0

## 2019-01-04 LAB — PREPARE FRESH FROZEN PLASMA
Unit division: 0
Unit division: 0

## 2019-01-04 LAB — BPAM FFP
Blood Product Expiration Date: 202003212359
Blood Product Expiration Date: 202003242359
ISSUE DATE / TIME: 202003072008
ISSUE DATE / TIME: 202003072008
Unit Type and Rh: 6200
Unit Type and Rh: 6200

## 2019-01-04 LAB — CBC
HCT: 41.6 % (ref 39.0–52.0)
Hemoglobin: 14.4 g/dL (ref 13.0–17.0)
MCH: 32.1 pg (ref 26.0–34.0)
MCHC: 34.6 g/dL (ref 30.0–36.0)
MCV: 92.9 fL (ref 80.0–100.0)
Platelets: 211 10*3/uL (ref 150–400)
RBC: 4.48 MIL/uL (ref 4.22–5.81)
RDW: 13.6 % (ref 11.5–15.5)
WBC: 16.5 10*3/uL — ABNORMAL HIGH (ref 4.0–10.5)
nRBC: 0 % (ref 0.0–0.2)

## 2019-01-04 LAB — BPAM RBC
Blood Product Expiration Date: 202004062359
Blood Product Expiration Date: 202004062359
ISSUE DATE / TIME: 202003072008
ISSUE DATE / TIME: 202003072008
Unit Type and Rh: 5100
Unit Type and Rh: 5100

## 2019-01-04 LAB — MRSA PCR SCREENING: MRSA by PCR: NEGATIVE

## 2019-01-04 LAB — HIV ANTIBODY (ROUTINE TESTING W REFLEX): HIV Screen 4th Generation wRfx: NONREACTIVE

## 2019-01-04 LAB — ABO/RH: ABO/RH(D): B POS

## 2019-01-04 MED ORDER — HYDROMORPHONE HCL 1 MG/ML IJ SOLN: 1.0000 mg | Freq: Two times a day (BID) | INTRAMUSCULAR | Status: DC | PRN

## 2019-01-04 MED ORDER — HYDROMORPHONE HCL 1 MG/ML IJ SOLN
1.0000 mg | INTRAMUSCULAR | Status: DC | PRN
  Administered 2019-01-04 – 2019-01-07 (×20): 1 mg via INTRAVENOUS
  Filled 2019-01-04 (×21): qty 1

## 2019-01-04 MED ORDER — ACETAMINOPHEN 500 MG PO TABS
1000.0000 mg | ORAL_TABLET | Freq: Four times a day (QID) | ORAL | Status: DC
  Administered 2019-01-04 – 2019-02-14 (×150): 1000 mg via ORAL
  Filled 2019-01-04 (×157): qty 2

## 2019-01-04 MED ORDER — METHOCARBAMOL 1000 MG/10ML IJ SOLN
500.0000 mg | Freq: Four times a day (QID) | INTRAVENOUS | Status: DC | PRN
  Administered 2019-01-04 – 2019-01-06 (×6): 500 mg via INTRAVENOUS
  Filled 2019-01-04: qty 5
  Filled 2019-01-04: qty 500
  Filled 2019-01-04 (×3): qty 5
  Filled 2019-01-04: qty 500
  Filled 2019-01-04: qty 5

## 2019-01-04 MED ORDER — PANTOPRAZOLE SODIUM 40 MG PO TBEC
40.0000 mg | DELAYED_RELEASE_TABLET | Freq: Every day | ORAL | Status: DC
  Administered 2019-01-05 – 2019-02-14 (×41): 40 mg via ORAL
  Filled 2019-01-04 (×41): qty 1

## 2019-01-04 MED ORDER — GABAPENTIN 300 MG PO CAPS
300.0000 mg | ORAL_CAPSULE | Freq: Three times a day (TID) | ORAL | Status: DC
  Administered 2019-01-04 – 2019-01-13 (×30): 300 mg via ORAL
  Filled 2019-01-04 (×30): qty 1

## 2019-01-04 MED ORDER — OXYCODONE HCL 5 MG PO TABS
5.0000 mg | ORAL_TABLET | ORAL | Status: DC | PRN
  Administered 2019-01-04 – 2019-01-07 (×13): 5 mg via ORAL
  Filled 2019-01-04 (×13): qty 1
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
## 2019-01-04 NOTE — Progress Notes (Signed)
Patient requested to have his parents called with the following information:  Maximos Kralicek (father) Melik Odonald (mom)  [phone]  No answer, did not leave a voicemail since xxx patient, will attempt again later
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
## 2019-01-05 ENCOUNTER — Inpatient Hospital Stay (HOSPITAL_COMMUNITY): Payer: Medicaid Other

## 2019-01-05 LAB — BASIC METABOLIC PANEL
ANION GAP: 10 (ref 5–15)
BUN: 9 mg/dL (ref 6–20)
CO2: 23 mmol/L (ref 22–32)
Calcium: 8.5 mg/dL — ABNORMAL LOW (ref 8.9–10.3)
Chloride: 100 mmol/L (ref 98–111)
Creatinine, Ser: 1.07 mg/dL (ref 0.61–1.24)
GFR calc Af Amer: >60 mL/min (ref >60)
GFR calc non Af Amer: >60 mL/min (ref >60)
GLUCOSE: 115 mg/dL — AB (ref 70–99)
POTASSIUM: 3.8 mmol/L (ref 3.5–5.1)
Sodium: 133 mmol/L — ABNORMAL LOW (ref 135–145)

## 2019-01-05 LAB — CBC
HCT: 29.7 % — ABNORMAL LOW (ref 39.0–52.0)
HEMATOCRIT: 33.2 % — AB (ref 39.0–52.0)
Hemoglobin: 11.5 g/dL — ABNORMAL LOW (ref 13.0–17.0)
Hemoglobin: 10.5 g/dL — ABNORMAL LOW (ref 13.0–17.0)
MCH: 31.9 pg (ref 26.0–34.0)
MCH: 32.3 pg (ref 26.0–34.0)
MCHC: 34.6 g/dL (ref 30.0–36.0)
MCHC: 35.4 g/dL (ref 30.0–36.0)
MCV: 92 fL (ref 80.0–100.0)
MCV: 91.4 fL (ref 80.0–100.0)
NRBC: 0 % (ref 0.0–0.2)
Platelets: 140 10*3/uL — ABNORMAL LOW (ref 150–400)
Platelets: 134 10*3/uL — ABNORMAL LOW (ref 150–400)
RBC: 3.61 MIL/uL — ABNORMAL LOW (ref 4.22–5.81)
RBC: 3.25 MIL/uL — ABNORMAL LOW (ref 4.22–5.81)
RDW: 13.1 % (ref 11.5–15.5)
RDW: 12.8 % (ref 11.5–15.5)
WBC: 10.5 10*3/uL (ref 4.0–10.5)
WBC: 11.7 10*3/uL — ABNORMAL HIGH (ref 4.0–10.5)
nRBC: 0 % (ref 0.0–0.2)

## 2019-01-05 MED ORDER — IOHEXOL 350 MG/ML SOLN
100.0000 mL | Freq: Once | INTRAVENOUS | Status: AC | PRN
  Administered 2019-01-05: 100 mL via INTRAVENOUS
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
## 2019-01-05 NOTE — Care Management (Addendum)
Pt does not have LTACH benefits.  CM will continue to follow for appropriate discharge disposition.
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
## 2019-01-06 LAB — CBC
HCT: 30 % — ABNORMAL LOW (ref 39.0–52.0)
Hemoglobin: 10.2 g/dL — ABNORMAL LOW (ref 13.0–17.0)
MCH: 31.3 pg (ref 26.0–34.0)
MCHC: 34 g/dL (ref 30.0–36.0)
MCV: 92 fL (ref 80.0–100.0)
Platelets: 123 10*3/uL — ABNORMAL LOW (ref 150–400)
RBC: 3.26 MIL/uL — ABNORMAL LOW (ref 4.22–5.81)
RDW: 12.7 % (ref 11.5–15.5)
WBC: 9.7 10*3/uL (ref 4.0–10.5)
nRBC: 0 % (ref 0.0–0.2)

## 2019-01-06 LAB — BASIC METABOLIC PANEL
Anion gap: 10 (ref 5–15)
BUN: 5 mg/dL — ABNORMAL LOW (ref 6–20)
CO2: 25 mmol/L (ref 22–32)
Calcium: 8.7 mg/dL — ABNORMAL LOW (ref 8.9–10.3)
Chloride: 98 mmol/L (ref 98–111)
Creatinine, Ser: 1.12 mg/dL (ref 0.61–1.24)
GFR calc Af Amer: >60 mL/min (ref >60)
GFR calc non Af Amer: >60 mL/min (ref >60)
GLUCOSE: 103 mg/dL — AB (ref 70–99)
Potassium: 3.5 mmol/L (ref 3.5–5.1)
Sodium: 133 mmol/L — ABNORMAL LOW (ref 135–145)

## 2019-01-06 LAB — BLOOD PRODUCT ORDER (VERBAL) VERIFICATION
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
## 2019-01-07 DIAGNOSIS — F411 Generalized anxiety disorder: Secondary | ICD-10-CM

## 2019-01-07 DIAGNOSIS — I10 Essential (primary) hypertension: Secondary | ICD-10-CM

## 2019-01-07 DIAGNOSIS — T07XXXA Unspecified multiple injuries, initial encounter: Secondary | ICD-10-CM

## 2019-01-07 DIAGNOSIS — F1910 Other psychoactive substance abuse, uncomplicated: Secondary | ICD-10-CM

## 2019-01-07 DIAGNOSIS — D62 Acute posthemorrhagic anemia: Secondary | ICD-10-CM

## 2019-01-07 DIAGNOSIS — F32A Depression, unspecified: Secondary | ICD-10-CM

## 2019-01-07 DIAGNOSIS — W3400XA Accidental discharge from unspecified firearms or gun, initial encounter: Secondary | ICD-10-CM

## 2019-01-07 DIAGNOSIS — F329 Major depressive disorder, single episode, unspecified: Secondary | ICD-10-CM

## 2019-01-07 LAB — CBC
HEMATOCRIT: 28 % — AB (ref 39.0–52.0)
Hemoglobin: 9.5 g/dL — ABNORMAL LOW (ref 13.0–17.0)
MCH: 31.5 pg (ref 26.0–34.0)
MCHC: 33.9 g/dL (ref 30.0–36.0)
MCV: 92.7 fL (ref 80.0–100.0)
Platelets: 121 10*3/uL — ABNORMAL LOW (ref 150–400)
RBC: 3.02 MIL/uL — ABNORMAL LOW (ref 4.22–5.81)
RDW: 12.5 % (ref 11.5–15.5)
WBC: 9.4 10*3/uL (ref 4.0–10.5)
nRBC: 0 % (ref 0.0–0.2)

## 2019-01-07 LAB — BASIC METABOLIC PANEL
Anion gap: 5 (ref 5–15)
BUN: 9 mg/dL (ref 6–20)
CO2: 31 mmol/L (ref 22–32)
Calcium: 8.9 mg/dL (ref 8.9–10.3)
Chloride: 99 mmol/L (ref 98–111)
Creatinine, Ser: 1.08 mg/dL (ref 0.61–1.24)
GFR calc Af Amer: >60 mL/min (ref >60)
GFR calc non Af Amer: >60 mL/min (ref >60)
Glucose, Bld: 110 mg/dL — ABNORMAL HIGH (ref 70–99)
Potassium: 3.5 mmol/L (ref 3.5–5.1)
Sodium: 135 mmol/L (ref 135–145)

## 2019-01-07 MED ORDER — OXYCODONE HCL 5 MG PO TABS
5.0000 mg | ORAL_TABLET | ORAL | Status: DC | PRN
  Administered 2019-01-07 – 2019-01-22 (×63): 10 mg via ORAL
  Filled 2019-01-07 (×64): qty 2

## 2019-01-07 MED ORDER — HYDROMORPHONE HCL 1 MG/ML IJ SOLN
1.0000 mg | INTRAMUSCULAR | Status: DC | PRN
  Administered 2019-01-07 – 2019-01-24 (×32): 1 mg via INTRAVENOUS
  Filled 2019-01-07 (×33): qty 1

## 2019-01-07 MED ORDER — METHOCARBAMOL 500 MG PO TABS: 500.0000 mg | ORAL_TABLET | Freq: Four times a day (QID) | ORAL | Status: DC | PRN

## 2019-01-07 MED ORDER — DOCUSATE SODIUM 100 MG PO CAPS
100.0000 mg | ORAL_CAPSULE | Freq: Every day | ORAL | Status: DC
  Administered 2019-01-07 – 2019-02-13 (×36): 100 mg via ORAL
  Filled 2019-01-07 (×38): qty 1

## 2019-01-07 MED ORDER — BISACODYL 10 MG RE SUPP
10.0000 mg | Freq: Every day | RECTAL | Status: DC
  Administered 2019-01-07 – 2019-01-16 (×3): 10 mg via RECTAL
  Filled 2019-01-07 (×18): qty 1

## 2019-01-07 MED ORDER — POLYETHYLENE GLYCOL 3350 17 G PO PACK
17.0000 g | PACK | Freq: Every day | ORAL | Status: DC
  Administered 2019-01-07 – 2019-02-14 (×34): 17 g via ORAL
  Filled 2019-01-07 (×36): qty 1

## 2019-01-07 MED ORDER — METHOCARBAMOL 500 MG PO TABS
500.0000 mg | ORAL_TABLET | Freq: Three times a day (TID) | ORAL | Status: DC
  Administered 2019-01-07 – 2019-01-09 (×6): 500 mg via ORAL
  Filled 2019-01-07 (×6): qty 1
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
## 2019-01-07 NOTE — Progress Notes (Signed)
Paged urology concerning pts foley output color. CBI clamped this morning and urine color is now amber red. MD to round back to see pt. Will continue to monitor.
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
## 2019-01-07 NOTE — Progress Notes (Signed)
Rehab Admissions Coordinator Note:  Patient was screened by Trish Mage for appropriateness for an Inpatient Acute Rehab Consult.  At this time, we are recommending Inpatient Rehab consult.  Trish Mage 01/07/2019, 1:56 PM  I can be reached at [phone].
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
## 2019-01-08 NOTE — Progress Notes (Signed)
It was noted that patient had no urine in catheter bag at 0400 irrigation. Bladder scan showed . Gavage was effective and of dark red urine was emptied. Directly after that 23ml of ns was flushed in and 3ml came out. Gavaged again and more urine was flushed. Patient reports relief when before he was quite uncomfortable. Many clots noted and urine is dark red. Will report to oncoming nurse. Mayford Knife RN

## 2019-01-08 NOTE — Progress Notes (Signed)
Inpatient Rehab Admissions:  Inpatient Rehab Consult received.  I met with patient at the bedside for rehabilitation assessment and to discuss goals and expectations of an inpatient rehab admission.  I also discussed with his father, via telephone, goals and expectations.  Pt and father agreeable to inpatient rehab stay, however father does not feel that he can bring patient home with him at discharge.  Pt is working to find alternative arrangements for d/c.  Will continue to follow.    Signed: Shann Medal, PT, DPT Admissions Coordinator [phone] 01/08/19  1:59 PM
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
## 2019-01-09 LAB — HEMOGLOBIN AND HEMATOCRIT, BLOOD
HCT: 27.6 % — ABNORMAL LOW (ref 39.0–52.0)
Hemoglobin: 9.7 g/dL — ABNORMAL LOW (ref 13.0–17.0)

## 2019-01-09 MED ORDER — METHOCARBAMOL 750 MG PO TABS
750.0000 mg | ORAL_TABLET | Freq: Three times a day (TID) | ORAL | Status: DC
  Administered 2019-01-09 – 2019-01-13 (×14): 750 mg via ORAL
  Filled 2019-01-09 (×14): qty 1

## 2019-01-09 MED ORDER — TRAMADOL HCL 50 MG PO TABS
50.0000 mg | ORAL_TABLET | Freq: Four times a day (QID) | ORAL | Status: DC
  Administered 2019-01-09 – 2019-01-14 (×20): 50 mg via ORAL
  Filled 2019-01-09 (×20): qty 1
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
## 2019-01-09 NOTE — Progress Notes (Signed)
Inpatient Rehab Admissions Coordinator:   I met with patient at bedside.  He continues to work with family on a discharge plan for post CIR.  Will continue to follow.    Shann Medal, PT, DPT Admissions Coordinator [phone] 01/09/19  12:34 PM
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
## 2019-01-10 LAB — HEMOGLOBIN AND HEMATOCRIT, BLOOD
HCT: 28.9 % — ABNORMAL LOW (ref 39.0–52.0)
Hemoglobin: 10.1 g/dL — ABNORMAL LOW (ref 13.0–17.0)
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
## 2019-01-12 LAB — HEMOGLOBIN AND HEMATOCRIT, BLOOD
HCT: 32.5 % — ABNORMAL LOW (ref 39.0–52.0)
Hemoglobin: 11.3 g/dL — ABNORMAL LOW (ref 13.0–17.0)
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
## 2019-01-12 NOTE — Progress Notes (Signed)
PT Cancellation Note  Patient Details Name: Brandon Glass MRN: [id number] DOB: 1981/04/23   Cancelled Treatment:    Reason Eval/Treat Not Completed: Pain limiting ability to participate(pt sitting EOB on arrival stating abdominal pain and declined mobility at this time)   Gillermo Poch B Patt Steinhardt 01/12/2019, 8:42 AM Delaney Meigs, PT Acute Rehabilitation Services Pager: [phone] Office: [phone]
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
## 2019-01-13 LAB — CBC
HCT: 35 % — ABNORMAL LOW (ref 39.0–52.0)
Hemoglobin: 12.3 g/dL — ABNORMAL LOW (ref 13.0–17.0)
MCH: 32 pg (ref 26.0–34.0)
MCHC: 35.1 g/dL (ref 30.0–36.0)
MCV: 91.1 fL (ref 80.0–100.0)
PLATELETS: 655 10*3/uL — AB (ref 150–400)
RBC: 3.84 MIL/uL — ABNORMAL LOW (ref 4.22–5.81)
RDW: 12 % (ref 11.5–15.5)
WBC: 12.2 10*3/uL — AB (ref 4.0–10.5)
nRBC: 0 % (ref 0.0–0.2)

## 2019-01-13 LAB — HEMOGLOBIN AND HEMATOCRIT, BLOOD
HCT: 34.1 % — ABNORMAL LOW (ref 39.0–52.0)
Hemoglobin: 12 g/dL — ABNORMAL LOW (ref 13.0–17.0)

## 2019-01-13 MED ORDER — ENOXAPARIN SODIUM 40 MG/0.4ML ~~LOC~~ SOLN
40.0000 mg | Freq: Every day | SUBCUTANEOUS | Status: DC
  Administered 2019-01-13 – 2019-02-14 (×33): 40 mg via SUBCUTANEOUS
  Filled 2019-01-13 (×33): qty 0.4

## 2019-01-13 NOTE — Progress Notes (Addendum)
Inpatient Rehab Admissions Coordinator:    I met, again, with patient at bedside to let him know I have not been able to confirm dispo following rehab.  I have phoned his mother and left voicemails with no call back from her, and his father has expressly stated that he is unable to have him in his home, or pay for a hotel room for him.  Inpatient rehab beds are limited at this time.  Discussed with CSW Denyse Amass, and we will sign off at this time. Please contact me with questions.   Shann Medal, PT, DPT Admissions Coordinator [phone] 01/13/19  12:48 PM
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
## 2019-01-14 LAB — CBC
HCT: 33.6 % — ABNORMAL LOW (ref 39.0–52.0)
Hemoglobin: 11.7 g/dL — ABNORMAL LOW (ref 13.0–17.0)
MCH: 32 pg (ref 26.0–34.0)
MCHC: 34.8 g/dL (ref 30.0–36.0)
MCV: 91.8 fL (ref 80.0–100.0)
Platelets: 720 10*3/uL — ABNORMAL HIGH (ref 150–400)
RBC: 3.66 MIL/uL — ABNORMAL LOW (ref 4.22–5.81)
RDW: 12 % (ref 11.5–15.5)
WBC: 12.7 10*3/uL — ABNORMAL HIGH (ref 4.0–10.5)
nRBC: 0 % (ref 0.0–0.2)

## 2019-01-14 MED ORDER — METHOCARBAMOL 500 MG PO TABS
1000.0000 mg | ORAL_TABLET | Freq: Three times a day (TID) | ORAL | Status: DC
  Administered 2019-01-14 – 2019-02-14 (×94): 1000 mg via ORAL
  Filled 2019-01-14 (×95): qty 2

## 2019-01-14 MED ORDER — TRAMADOL HCL 50 MG PO TABS
100.0000 mg | ORAL_TABLET | Freq: Four times a day (QID) | ORAL | Status: DC
  Administered 2019-01-14 – 2019-02-14 (×119): 100 mg via ORAL
  Filled 2019-01-14 (×122): qty 2

## 2019-01-14 MED ORDER — WHITE PETROLATUM EX OINT
TOPICAL_OINTMENT | CUTANEOUS | Status: AC
  Administered 2019-01-14: 13:00:00
  Filled 2019-01-14: qty 28.35

## 2019-01-14 MED ORDER — GABAPENTIN 400 MG PO CAPS
400.0000 mg | ORAL_CAPSULE | Freq: Three times a day (TID) | ORAL | Status: DC
  Administered 2019-01-14 – 2019-02-14 (×94): 400 mg via ORAL
  Filled 2019-01-14 (×94): qty 1
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
## 2019-01-15 LAB — CBC
HCT: 31.7 % — ABNORMAL LOW (ref 39.0–52.0)
Hemoglobin: 11.1 g/dL — ABNORMAL LOW (ref 13.0–17.0)
MCH: 31.8 pg (ref 26.0–34.0)
MCHC: 35 g/dL (ref 30.0–36.0)
MCV: 90.8 fL (ref 80.0–100.0)
Platelets: 759 10*3/uL — ABNORMAL HIGH (ref 150–400)
RBC: 3.49 MIL/uL — ABNORMAL LOW (ref 4.22–5.81)
RDW: 12 % (ref 11.5–15.5)
WBC: 12 10*3/uL — ABNORMAL HIGH (ref 4.0–10.5)
nRBC: 0 % (ref 0.0–0.2)
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
## 2019-01-16 LAB — BASIC METABOLIC PANEL
Anion gap: 8 (ref 5–15)
BUN: 20 mg/dL (ref 6–20)
CO2: 29 mmol/L (ref 22–32)
Calcium: 9.8 mg/dL (ref 8.9–10.3)
Chloride: 96 mmol/L — ABNORMAL LOW (ref 98–111)
Creatinine, Ser: 1.01 mg/dL (ref 0.61–1.24)
GFR calc Af Amer: >60 mL/min (ref >60)
GFR calc non Af Amer: >60 mL/min (ref >60)
Glucose, Bld: 96 mg/dL (ref 70–99)
Potassium: 4.6 mmol/L (ref 3.5–5.1)
Sodium: 133 mmol/L — ABNORMAL LOW (ref 135–145)

## 2019-01-16 LAB — HEMOGLOBIN AND HEMATOCRIT, BLOOD
HCT: 34.4 % — ABNORMAL LOW (ref 39.0–52.0)
Hemoglobin: 11.6 g/dL — ABNORMAL LOW (ref 13.0–17.0)
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
## 2019-01-17 NOTE — Plan of Care (Signed)
  Problem: Education: Goal: Knowledge of General Education information will improve Description Including pain rating scale, medication(s)/side effects and non-pharmacologic comfort measures Outcome: Progressing   Problem: Health Behavior/Discharge Planning: Goal: Ability to manage health-related needs will improve Outcome: Progressing   
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
## 2019-01-19 NOTE — Clinical Social Work Note (Signed)
Clinical Social Worker continuing to follow patient and family for support and discharge planning needs.  Patient continues to have no available bed offers at this time.  CSW continues to try and find accurate placement to best meet patient needs with lack of insurance coverage.  CSW remains available for support and to facilitate patient discharge needs once bed available.  Macario Golds, [place] [phone]
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
## 2019-01-20 LAB — HEMOGLOBIN AND HEMATOCRIT, BLOOD
HCT: 35.6 % — ABNORMAL LOW (ref 39.0–52.0)
Hemoglobin: 11.7 g/dL — ABNORMAL LOW (ref 13.0–17.0)
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
## 2019-01-21 NOTE — Progress Notes (Signed)
Pt was able to appropriately demonstrate how to use an in and out catheter. The first time pt reported he thought he felt like his bladder was full and bladder scan was done. Bladder scan was not done on the subsequent times he catheterized himself. Urine is still tea colored. Will report to day shift nurse.  Mayford Knife RN

## 2019-01-22 LAB — HEMOGLOBIN AND HEMATOCRIT, BLOOD
HCT: 32.5 % — ABNORMAL LOW (ref 39.0–52.0)
Hemoglobin: 10.9 g/dL — ABNORMAL LOW (ref 13.0–17.0)

## 2019-01-22 NOTE — Progress Notes (Signed)
PT Cancellation Note  Patient Details Name: Brandon Glass MRN: [id number] DOB: 10/21/1981   Cancelled Treatment:    Reason Eval/Treat Not Completed: Other (comment)(pt currently on [facility] stating need for continued voiding, cleaning and lunch prior to agreeing to mobilize. Discussed plan for increaed obstacles and outside in [facility] and will defer to next date. Pt aware and agreeable)   [name] 01/22/2019, 1:14 PM  [name] [name], PT Acute Rehabilitation Services Pager: [phone] Office: [phone]
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
## 2019-01-23 MED ORDER — OXYCODONE HCL 5 MG PO TABS
10.0000 mg | ORAL_TABLET | ORAL | Status: DC | PRN
  Administered 2019-01-23 – 2019-01-25 (×11): 15 mg via ORAL
  Filled 2019-01-23 (×11): qty 3
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
## 2019-01-23 NOTE — Progress Notes (Addendum)
CSW reached out to [facility] to follow up regarding referral sent on Wednesday. Pending decision.   Update: [facility] emailed DSS is waiting for response and information regarding patient's income.    [place], [place] [phone]
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
## 2019-01-24 MED ORDER — HYDROMORPHONE HCL 1 MG/ML IJ SOLN
1.0000 mg | Freq: Four times a day (QID) | INTRAMUSCULAR | Status: DC | PRN
  Administered 2019-01-24 – 2019-01-25 (×3): 1 mg via INTRAVENOUS
  Filled 2019-01-24 (×3): qty 1

## 2019-01-24 MED ORDER — HYDROMORPHONE HCL 1 MG/ML IJ SOLN: 1.0000 mg | INTRAMUSCULAR | Status: DC | PRN

## 2019-01-24 NOTE — Progress Notes (Addendum)
   01/23/19 1830  What Happened  Was fall witnessed? No  Was patient injured? No  Patient found other (Comment) (Pt reported fall to staff)  Found by No one-pt stated they fell  Stated prior activity to/from bed, chair, or stretcher  Follow Up  MD notified Dr. Andrey Campanile   Time MD notified [phone]  Family notified No- patient refusal  Additional tests No  Adult Fall Risk Assessment  Risk Factor Category (scoring not indicated) Fall has occurred during this admission (document High fall risk);High fall risk per protocol (document High fall risk)  Patient Fall Risk Level High fall risk  Adult Fall Risk Interventions  Required Bundle Interventions *See Row Information* High fall risk - low, moderate, and high requirements implemented  Additional Interventions Use of appropriate toileting equipment (bedpan, BSC, etc.)  Screening for Fall Injury Risk (To be completed on HIGH fall risk patients) - Assessing Need for Low Bed  Risk For Fall Injury- Low Bed Criteria None identified - Continue screening  Will Implement Low Bed and Floor Mats Low bed contraindicated, floor mats in place  Screening for Fall Injury Risk (To be completed on HIGH fall risk patients who do not meet crieteria for Low Bed) - Assessing Need for Floor Mats Only  Risk For Fall Injury- Criteria for Floor Mats None identified - No additional interventions needed
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
## 2019-01-25 ENCOUNTER — Inpatient Hospital Stay (HOSPITAL_COMMUNITY): Payer: Medicaid Other

## 2019-01-25 MED ORDER — OXYCODONE HCL 5 MG PO TABS
10.0000 mg | ORAL_TABLET | ORAL | Status: DC | PRN
  Administered 2019-01-25 – 2019-02-06 (×55): 20 mg via ORAL
  Administered 2019-02-06: 10 mg via ORAL
  Administered 2019-02-06 – 2019-02-14 (×42): 20 mg via ORAL
  Filled 2019-01-25 (×81): qty 4
  Filled 2019-01-25: qty 2
  Filled 2019-01-25 (×21): qty 4

## 2019-01-25 MED ORDER — HYDROMORPHONE HCL 1 MG/ML IJ SOLN
0.5000 mg | Freq: Four times a day (QID) | INTRAMUSCULAR | Status: DC | PRN
  Administered 2019-01-25: 0.5 mg via INTRAVENOUS
  Filled 2019-01-25: qty 1
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
## 2019-01-29 NOTE — Progress Notes (Signed)
Inpatient Rehab Admissions Coordinator:    I spoke with CM and CSW who report pt's brother and mother are working on a hotel room for pt at discharge.  I met with patient today and he states he is planning to speak with his brother when he gets off work at [employer] today.  I will meet with patient at bedside to discuss with patient and his brother possible discharge disposition to see if CIR would be an option.    Shann Medal, PT, DPT Admissions Coordinator [phone] 01/29/19  12:22 PM
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
## 2019-01-30 NOTE — Progress Notes (Signed)
Inpatient Rehab Admissions Coordinator: Late entry  I returned to pt's room and he informed me that his brother was unavailable until 6pm.  I am unavailable at that time. Will follow up on 4/3 to try and arrange conference call with pt and his brother to determine possible dispo.   Estill Dooms, PT, DPT Admissions Coordinator [phone] 01/30/19  9:55 AM

## 2019-01-30 NOTE — Progress Notes (Addendum)
Inpatient Rehab Admissions Coordinator:    Pt up in w/c on arrival.  Per nursing he is able to transfer at mod I level.  Currently too functional for CIR stay.  I have alerted the CM.  Will sign off at this time.    Estill Dooms, PT, DPT Admissions Coordinator [phone] 01/30/19  11:45 AM
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
## 2019-02-01 NOTE — Progress Notes (Signed)
Patient placed condom cath on himself approximately 0230 before his 0330 I/O cath time to see if he could " urinate on his own."  Patein up several times independently from bed to wheelchair out in the halls without any difficulty. Patient stated he wanted to wait a little longer before taking the cath off to see if would maybe void on his own.
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
## 2019-02-03 NOTE — Progress Notes (Signed)
PT Cancellation Note  Patient Details Name: Brandon Glass MRN: [id number] DOB: 1981/10/24   Cancelled Treatment:    Reason Eval/Treat Not Completed: Patient declined, no reason specified(pt reports increased back pain despite premedication and declined any mobility or activity at this time)   Brandon Glass 02/03/2019, 1:35 PM Delaney Meigs, PT Acute Rehabilitation Services Pager: [phone] Office: [phone]
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
## 2019-02-03 NOTE — Care Management (Signed)
Met with pt to discuss discharge arrangements; pt states his family is unable to financially provide hotel room for him until next Saturday.  We discussed DME needs, home health and cath supply issues, which I will be assisting him with.  Pt is grateful for assistance.    Referral has been made to [employer] for DME needs.  Pt will be fitted for specialty wheelchair on Wednesday, April 8 at 11am, by Josh Cadle, DME specialist.  WC may not be available by dc date, but they will provide loaner in the interim.    I have spoken with Stephen Carroll, [place] urological Territory Manager for [facility] regarding possible samples for urinary catheters until Medicaid is active.  He states he can likely provide 30-60 days of cath samples until Medicaid can provide monthly deliveries of supplies.  Will await email of application form to complete with patient to start process.     W. , RN, BSN  Trauma/Neuro ICU Case Manager [phone] 
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
## 2019-02-04 NOTE — Care Management (Signed)
Pt was fitted for lightweight custom WC this morning with [employer] rep.  Mobility/Seating Evaluation given to Marcy Siren with OT for completion.  Urology Order Form faxed to Anise Salvo with [facility] to initiate delivery of self-cath supplies.  Drop arm BSC has been delivered to pt's room.    Patient aware that we will need discharge address information as soon as available to coordinate delivery of cath supplies, and for [facility] to follow up with him.  He still maintains that his family will be able to financially secure a safe, handicapped accessible hotel room by 02/14/2019.    Quintella Baton, RN, BSN  Trauma/Neuro ICU Case Manager [phone]
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
## 2019-02-05 NOTE — Plan of Care (Signed)
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
## 2019-02-05 NOTE — Progress Notes (Signed)
PT Cancellation Note  Patient Details Name: Brandon Glass MRN: [id number] DOB: May 05, 1981   Cancelled Treatment:    Reason Eval/Treat Not Completed: Patient declined, no reason specified.  I don't feel like it really,  I thought I was done with rehab. 02/05/2019  La Vernia Bing, PT Acute Rehabilitation Services [phone]  (pager) [phone]  (office)   Eliseo Gum Jamiracle Avants 02/05/2019, 2:55 PM
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
## 2019-02-06 NOTE — Plan of Care (Signed)
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
## 2019-02-09 NOTE — Progress Notes (Signed)
PT Cancellation Note  Patient Details Name: Brandon Glass MRN: [id number] DOB: [birth year]   Cancelled Treatment:    Reason Eval/Treat Not Completed: Other (comment) Pt has been Mod I with w/c and transfers without difficulty as well as propelling around floor for exercise daily. Reports no questions or concerns about mobility at this time. Eager to get his own light weight w/c which he was measured for last week. Would like an update on his w/c. PT will be checking in on patient to see if new w/c gets delivered prior to returning to go over specs of new chair and performing higher level activities on different surfaces etc. Will follow.   Blake Divine A Lekendrick Alpern 02/09/2019, 11:32 AM Mylo Red, PT, DPT Acute Rehabilitation Services Pager [phone] Office [phone]
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
## 2019-02-09 NOTE — Progress Notes (Signed)
OT Cancellation Note  Patient Details Name: TYRAIL ALPERS MRN: [id number] DOB: [birth year]   Cancelled Treatment:    Reason Eval/Treat Not Completed: Other (comment); pt overall has meet acute OT goals at this time and reports feeling comfortable performing ADL/iADL and mobility tasks. Pt eager for lightweight wheelchair to be delivered (expecting sometime this week). Will continue to check in during pt stay to ensure no further acute OT needs prior to pt discharge.  Marcy Siren, OT Supplemental Rehabilitation Services Pager [phone] Office [phone]   Orlando Penner 02/09/2019, 4:07 PM
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
## 2019-02-10 NOTE — Care Management (Signed)
Met with [facility] to discuss discharge arrangements.  Pt's loaner WC should be delivered within next 1-2 days, per Felton Clinton with [facility].  Will touch base with Alba Cory in AM regarding delivery of cath samples to pt by Friday.  Pt has been approved for [facility] follow up through [facility], yet he still does not know what hotel he is going to.  He has been in contact with admission liaison Glyn Ade with [facility], and plans to call her once he has hotel information and room number.  Pt states he prefers that DME and cath supplies be delivered to his parents home, rather than the hotel.  He has no questions currently.  Will continue to check in with him this week for finalization of dc plans.    Reinaldo Raddle, RN, BSN  Trauma/Neuro ICU Case Manager [phone]

## 2019-02-11 NOTE — Care Management (Signed)
Left message with Anise Salvo, Territory Manager for [facility], to discuss delivery of cath supplies for pt prior to dc.  Will follow with updates.    Quintella Baton, RN, BSN  Trauma/Neuro ICU Case Manager [phone]

## 2019-02-11 NOTE — Progress Notes (Signed)
PT Cancellation Note  Patient Details Name: Brandon Glass MRN: [id number] DOB: 01-Jan-1981   Cancelled Treatment:    Reason Eval/Treat Not Completed: Other (comment); Checked on pt to see if received loaner w/c, but not in room.  Reports no concerns or questions at this time.  Will attempt again another day for readiness for w/c training.    [name] 02/11/2019, 11:23 AM  Sheran Lawless, PT Acute Rehabilitation Services [phone] 02/11/2019
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
## 2019-02-13 MED ORDER — OXYCODONE HCL 10 MG PO TABS: 10.0000 mg | ORAL_TABLET | Freq: Four times a day (QID) | ORAL | 0 refills | Status: DC | PRN

## 2019-02-13 MED ORDER — POLYETHYLENE GLYCOL 3350 17 G PO PACK: 17.0000 g | PACK | Freq: Every day | ORAL | 0 refills | Status: DC

## 2019-02-13 MED ORDER — TRAMADOL HCL 50 MG PO TABS: 50.0000 mg | ORAL_TABLET | Freq: Four times a day (QID) | ORAL | 0 refills | Status: DC | PRN

## 2019-02-13 MED ORDER — METHOCARBAMOL 500 MG PO TABS: 1000.0000 mg | ORAL_TABLET | Freq: Three times a day (TID) | ORAL | 0 refills | Status: DC | PRN

## 2019-02-13 MED ORDER — ACETAMINOPHEN 500 MG PO TABS: 500.0000 mg | ORAL_TABLET | Freq: Four times a day (QID) | ORAL | 0 refills | Status: DC | PRN

## 2019-02-13 MED ORDER — DOCUSATE SODIUM 100 MG PO CAPS: 100.0000 mg | ORAL_CAPSULE | Freq: Every day | ORAL | 0 refills | Status: DC

## 2019-02-13 MED ORDER — BISACODYL 10 MG RE SUPP: 10.0000 mg | Freq: Every day | RECTAL | 0 refills | Status: DC

## 2019-02-13 MED ORDER — GABAPENTIN 400 MG PO CAPS: 400.0000 mg | ORAL_CAPSULE | Freq: Three times a day (TID) | ORAL | 0 refills | Status: DC

## 2019-02-13 MED FILL — METHOCARBAMOL 500 MG TABLET: 500 | 10 days supply | Qty: 60 | Fill #0

## 2019-02-13 MED FILL — traMADol HCL 50 MG TABS: 50 | 20 days supply | Qty: 60 | Fill #0

## 2019-02-13 MED FILL — GABAPENTIN 400 MG CAPSULE: 400 | 10 days supply | Qty: 60 | Fill #0

## 2019-02-13 MED FILL — oxyCODONE HCL 10 MG TABS: 10 | 10 days supply | Qty: 60 | Fill #0

## 2019-02-13 NOTE — Care Management (Signed)
Pt received overnighted package from [facility] containing urinary cath samples.  Plan dc to hotel tomorrow, per brother's arrangement.  Pt to notify [facility] agency once hotel arrangements are made.  [facility] and [facility] to follow up with pt outpatient for continued cath supplies and fitted wheel chair delivery, respectively.    Quintella Baton, RN, BSN  Trauma/Neuro ICU Case Manager [phone]
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
## 2019-02-14 NOTE — Progress Notes (Signed)
Patient discharged home with brother. Discharged instructions given to patient with stated understanding of instructions. Patient's belonging: [facility], Catheter supplies, and personal belonging was transported with patient at the time of discharge. Staff informed patient the importance of follow-up appointments. Medications ordered from transitional pharmacy was given to patient at discharge.

## 2019-02-14 NOTE — Progress Notes (Signed)
CSW was contacted about issue with patient's medication being sent to the [facility] pharmacy that is closed on weekends. CSW consulted with RNCM who will look at [facility] letter and follow up with nurse on the unit. CSW will continue to follow as needed.  Tenna Delaine, LCSW, LCAS-A Clinical Social Worker II [phone]

## 2019-02-14 NOTE — Care Management (Signed)
Spoke w Dianne in [facility] [phone]) who found medications for home. She will contact bedside nurse to coordinate delivering meds to patient prior to DC

## 2019-02-16 ENCOUNTER — Encounter (HOSPITAL_COMMUNITY): Payer: Self-pay | Admitting: Emergency Medicine

## 2020-04-11 ENCOUNTER — Ambulatory Visit: Payer: Medicaid Other | Admitting: Family Medicine

## 2020-04-21 ENCOUNTER — Ambulatory Visit (INDEPENDENT_AMBULATORY_CARE_PROVIDER_SITE_OTHER): Payer: Medicaid Other | Admitting: Primary Care

## 2020-06-22 ENCOUNTER — Other Ambulatory Visit: Payer: Self-pay

## 2020-06-22 ENCOUNTER — Encounter (INDEPENDENT_AMBULATORY_CARE_PROVIDER_SITE_OTHER): Payer: Self-pay | Admitting: Primary Care

## 2020-06-22 ENCOUNTER — Ambulatory Visit (INDEPENDENT_AMBULATORY_CARE_PROVIDER_SITE_OTHER): Payer: Medicaid Other | Admitting: Primary Care

## 2020-06-22 VITALS — BP 150/95 | HR 85 | Temp 98.7°F | Resp 16 | Ht 76.0 in

## 2020-06-22 DIAGNOSIS — S37002D Unspecified injury of left kidney, subsequent encounter: Secondary | ICD-10-CM

## 2020-06-22 DIAGNOSIS — Z13220 Encounter for screening for lipoid disorders: Secondary | ICD-10-CM | POA: Diagnosis not present

## 2020-06-22 DIAGNOSIS — R009 Unspecified abnormalities of heart beat: Secondary | ICD-10-CM

## 2020-06-22 DIAGNOSIS — Z7689 Persons encountering health services in other specified circumstances: Secondary | ICD-10-CM

## 2020-06-22 DIAGNOSIS — R030 Elevated blood-pressure reading, without diagnosis of hypertension: Secondary | ICD-10-CM

## 2020-06-22 NOTE — Patient Instructions (Signed)
   Managing Your Hypertension Hypertension is commonly called high blood pressure. This is when the force of your blood pressing against the walls of your arteries is too strong. Arteries are blood vessels that carry blood from your heart throughout your body. Hypertension forces the heart to work harder to pump blood, and may cause the arteries to become narrow or stiff. Having untreated or uncontrolled hypertension can cause heart attack, stroke, kidney disease, and other problems. What are blood pressure readings? A blood pressure reading consists of a higher number over a lower number. Ideally, your blood pressure should be below 120/80. The first ("top") number is called the systolic pressure. It is a measure of the pressure in your arteries as your heart beats. The second ("bottom") number is called the diastolic pressure. It is a measure of the pressure in your arteries as the heart relaxes. What does my blood pressure reading mean? Blood pressure is classified into four stages. Based on your blood pressure reading, your health care provider may use the following stages to determine what type of treatment you need, if any. Systolic pressure and diastolic pressure are measured in a unit called mm Hg. Normal  Systolic pressure: below 120.  Diastolic pressure: below 80. Elevated  Systolic pressure: 120-129.  Diastolic pressure: below 80. Hypertension stage 1  Systolic pressure: 130-139.  Diastolic pressure: 80-89. Hypertension stage 2  Systolic pressure: 140 or above.  Diastolic pressure: 90 or above. What health risks are associated with hypertension? Managing your hypertension is an important responsibility. Uncontrolled hypertension can lead to:  A heart attack.  A stroke.  A weakened blood vessel (aneurysm).  Heart failure.  Kidney damage.  Eye damage.  Metabolic syndrome.  Memory and concentration problems. What changes can I make to manage my  hypertension? Hypertension can be managed by making lifestyle changes and possibly by taking medicines. Your health care provider will help you make a plan to bring your blood pressure within a normal range. Eating and drinking   Eat a diet that is high in fiber and potassium, and low in salt (sodium), added sugar, and fat. An example eating plan is called the DASH (Dietary Approaches to Stop Hypertension) diet. To eat this way: ? Eat plenty of fresh fruits and vegetables. Try to fill half of your plate at each meal with fruits and vegetables. ? Eat whole grains, such as whole wheat pasta, brown rice, or whole grain bread. Fill about one quarter of your plate with whole grains. ? Eat low-fat diary products. ? Avoid fatty cuts of meat, processed or cured meats, and poultry with skin. Fill about one quarter of your plate with lean proteins such as fish, chicken without skin, beans, eggs, and tofu. ? Avoid premade and processed foods. These tend to be higher in sodium, added sugar, and fat.  Reduce your daily sodium intake. Most people with hypertension should eat less than 1,500 mg of sodium a day.  Limit alcohol intake to no more than 1 drink a day for nonpregnant women and 2 drinks a day for men. One drink equals 12 oz of beer, 5 oz of wine, or 1 oz of hard liquor. Lifestyle  Work with your health care provider to maintain a healthy body weight, or to lose weight. Ask what an ideal weight is for you.  Get at least 30 minutes of exercise that causes your heart to beat faster (aerobic exercise) most days of the week. Activities may include walking, swimming, or biking.    Include exercise to strengthen your muscles (resistance exercise), such as weight lifting, as part of your weekly exercise routine. Try to do these types of exercises for 30 minutes at least 3 days a week.  Do not use any products that contain nicotine or tobacco, such as cigarettes and e-cigarettes. If you need help quitting,  ask your health care provider.  Control any long-term (chronic) conditions you have, such as high cholesterol or diabetes. Monitoring  Monitor your blood pressure at home as told by your health care provider. Your personal target blood pressure may vary depending on your medical conditions, your age, and other factors.  Have your blood pressure checked regularly, as often as told by your health care provider. Working with your health care provider  Review all the medicines you take with your health care provider because there may be side effects or interactions.  Talk with your health care provider about your diet, exercise habits, and other lifestyle factors that may be contributing to hypertension.  Visit your health care provider regularly. Your health care provider can help you create and adjust your plan for managing hypertension. Will I need medicine to control my blood pressure? Your health care provider may prescribe medicine if lifestyle changes are not enough to get your blood pressure under control, and if:  Your systolic blood pressure is 130 or higher.  Your diastolic blood pressure is 80 or higher. Take medicines only as told by your health care provider. Follow the directions carefully. Blood pressure medicines must be taken as prescribed. The medicine does not work as well when you skip doses. Skipping doses also puts you at risk for problems. Contact a health care provider if:  You think you are having a reaction to medicines you have taken.  You have repeated (recurrent) headaches.  You feel dizzy.  You have swelling in your ankles.  You have trouble with your vision. Get help right away if:  You develop a severe headache or confusion.  You have unusual weakness or numbness, or you feel faint.  You have severe pain in your chest or abdomen.  You vomit repeatedly.  You have trouble breathing. Summary  Hypertension is when the force of blood pumping  through your arteries is too strong. If this condition is not controlled, it may put you at risk for serious complications.  Your personal target blood pressure may vary depending on your medical conditions, your age, and other factors. For most people, a normal blood pressure is less than 120/80.  Hypertension is managed by lifestyle changes, medicines, or both. Lifestyle changes include weight loss, eating a healthy, low-sodium diet, exercising more, and limiting alcohol. This information is not intended to replace advice given to you by your health care provider. Make sure you discuss any questions you have with your health care provider. Document Revised: 02/06/2019 Document Reviewed: 09/12/2016 Elsevier Patient Education  2020 [employer]  
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
## 2020-06-23 LAB — CMP14+EGFR
ALT: 24 IU/L (ref 0–44)
AST: 28 IU/L (ref 0–40)
Albumin/Globulin Ratio: 1.7 (ref 1.2–2.2)
Albumin: 4.9 g/dL (ref 4.0–5.0)
Alkaline Phosphatase: 64 IU/L (ref 48–121)
BUN/Creatinine Ratio: 14 (ref 9–20)
BUN: 13 mg/dL (ref 6–20)
Bilirubin Total: 0.3 mg/dL (ref 0.0–1.2)
CO2: 25 mmol/L (ref 20–29)
Calcium: 10 mg/dL (ref 8.7–10.2)
Chloride: 100 mmol/L (ref 96–106)
Creatinine, Ser: 0.96 mg/dL (ref 0.76–1.27)
GFR calc Af Amer: 115 mL/min/{1.73_m2} (ref >59)
GFR calc non Af Amer: 100 mL/min/{1.73_m2} (ref >59)
Globulin, Total: 2.9 g/dL (ref 1.5–4.5)
Glucose: 86 mg/dL (ref 65–99)
Potassium: 4.2 mmol/L (ref 3.5–5.2)
Sodium: 138 mmol/L (ref 134–144)
Total Protein: 7.8 g/dL (ref 6.0–8.5)

## 2020-06-23 LAB — CBC WITH DIFFERENTIAL/PLATELET
Basophils Absolute: 0 10*3/uL (ref 0.0–0.2)
Basos: 1 %
EOS (ABSOLUTE): 0.1 10*3/uL (ref 0.0–0.4)
Eos: 1 %
Hematocrit: 43.4 % (ref 37.5–51.0)
Hemoglobin: 14.7 g/dL (ref 13.0–17.7)
Immature Grans (Abs): 0 10*3/uL (ref 0.0–0.1)
Immature Granulocytes: 0 %
Lymphocytes Absolute: 2.2 10*3/uL (ref 0.7–3.1)
Lymphs: 26 %
MCH: 32 pg (ref 26.6–33.0)
MCHC: 33.9 g/dL (ref 31.5–35.7)
MCV: 95 fL (ref 79–97)
Monocytes Absolute: 0.7 10*3/uL (ref 0.1–0.9)
Monocytes: 8 %
Neutrophils Absolute: 5.5 10*3/uL (ref 1.4–7.0)
Neutrophils: 64 %
Platelets: 331 10*3/uL (ref 150–450)
RBC: 4.59 x10E6/uL (ref 4.14–5.80)
RDW: 13.1 % (ref 11.6–15.4)
WBC: 8.5 10*3/uL (ref 3.4–10.8)

## 2020-06-23 LAB — LIPID PANEL
Chol/HDL Ratio: 4.2 ratio (ref 0.0–5.0)
Cholesterol, Total: 206 mg/dL — ABNORMAL HIGH (ref 100–199)
HDL: 49 mg/dL (ref >39)
LDL Chol Calc (NIH): 134 mg/dL — ABNORMAL HIGH (ref 0–99)
Triglycerides: 131 mg/dL (ref 0–149)
VLDL Cholesterol Cal: 23 mg/dL (ref 5–40)

## 2020-06-26 ENCOUNTER — Other Ambulatory Visit (INDEPENDENT_AMBULATORY_CARE_PROVIDER_SITE_OTHER): Payer: Self-pay | Admitting: Primary Care

## 2020-06-26 DIAGNOSIS — E7800 Pure hypercholesterolemia, unspecified: Secondary | ICD-10-CM

## 2020-08-16 ENCOUNTER — Other Ambulatory Visit: Payer: Self-pay

## 2020-08-16 ENCOUNTER — Emergency Department (HOSPITAL_COMMUNITY): Payer: Medicaid Other

## 2020-08-16 ENCOUNTER — Inpatient Hospital Stay (HOSPITAL_COMMUNITY): Payer: Medicaid Other | Admitting: Anesthesiology

## 2020-08-16 ENCOUNTER — Encounter (HOSPITAL_COMMUNITY): Payer: Self-pay | Admitting: Emergency Medicine

## 2020-08-16 ENCOUNTER — Encounter (HOSPITAL_COMMUNITY)
Admission: EM | Disposition: A | Discharge status: Home / Self Care | Payer: Self-pay | Source: Home / Self Care | Attending: Family Medicine

## 2020-08-16 ENCOUNTER — Inpatient Hospital Stay (HOSPITAL_COMMUNITY)
Admission: EM | Admit: 2020-08-16 | Discharge: 2020-08-26 | DRG: 853 | Disposition: A | Discharge status: Home / Self Care | Payer: Medicaid Other | Attending: Internal Medicine | Admitting: Internal Medicine

## 2020-08-16 DIAGNOSIS — F17200 Nicotine dependence, unspecified, uncomplicated: Secondary | ICD-10-CM | POA: Diagnosis present

## 2020-08-16 DIAGNOSIS — S32029S Unspecified fracture of second lumbar vertebra, sequela: Secondary | ICD-10-CM | POA: Diagnosis not present

## 2020-08-16 DIAGNOSIS — L89319 Pressure ulcer of right buttock, unspecified stage: Secondary | ICD-10-CM | POA: Diagnosis not present

## 2020-08-16 DIAGNOSIS — W3400XS Accidental discharge from unspecified firearms or gun, sequela: Secondary | ICD-10-CM | POA: Diagnosis not present

## 2020-08-16 DIAGNOSIS — F32A Depression, unspecified: Secondary | ICD-10-CM | POA: Diagnosis present

## 2020-08-16 DIAGNOSIS — A419 Sepsis, unspecified organism: Secondary | ICD-10-CM | POA: Diagnosis present

## 2020-08-16 DIAGNOSIS — F1010 Alcohol abuse, uncomplicated: Secondary | ICD-10-CM | POA: Diagnosis present

## 2020-08-16 DIAGNOSIS — F1910 Other psychoactive substance abuse, uncomplicated: Secondary | ICD-10-CM | POA: Diagnosis present

## 2020-08-16 DIAGNOSIS — L89314 Pressure ulcer of right buttock, stage 4: Secondary | ICD-10-CM | POA: Diagnosis present

## 2020-08-16 DIAGNOSIS — F419 Anxiety disorder, unspecified: Secondary | ICD-10-CM | POA: Diagnosis present

## 2020-08-16 DIAGNOSIS — D75839 Thrombocytosis, unspecified: Secondary | ICD-10-CM | POA: Diagnosis present

## 2020-08-16 DIAGNOSIS — N493 Fournier gangrene: Secondary | ICD-10-CM | POA: Diagnosis present

## 2020-08-16 DIAGNOSIS — E876 Hypokalemia: Secondary | ICD-10-CM | POA: Diagnosis present

## 2020-08-16 DIAGNOSIS — N319 Neuromuscular dysfunction of bladder, unspecified: Secondary | ICD-10-CM | POA: Diagnosis present

## 2020-08-16 DIAGNOSIS — E871 Hypo-osmolality and hyponatremia: Secondary | ICD-10-CM | POA: Diagnosis present

## 2020-08-16 DIAGNOSIS — E861 Hypovolemia: Secondary | ICD-10-CM | POA: Diagnosis present

## 2020-08-16 DIAGNOSIS — Z20822 Contact with and (suspected) exposure to covid-19: Secondary | ICD-10-CM | POA: Diagnosis present

## 2020-08-16 DIAGNOSIS — Z993 Dependence on wheelchair: Secondary | ICD-10-CM | POA: Diagnosis not present

## 2020-08-16 DIAGNOSIS — N5089 Other specified disorders of the male genital organs: Secondary | ICD-10-CM | POA: Diagnosis present

## 2020-08-16 DIAGNOSIS — S32019S Unspecified fracture of first lumbar vertebra, sequela: Secondary | ICD-10-CM

## 2020-08-16 DIAGNOSIS — K592 Neurogenic bowel, not elsewhere classified: Secondary | ICD-10-CM | POA: Diagnosis present

## 2020-08-16 DIAGNOSIS — G8220 Paraplegia, unspecified: Secondary | ICD-10-CM | POA: Diagnosis present

## 2020-08-16 DIAGNOSIS — S34101S Unspecified injury to L1 level of lumbar spinal cord, sequela: Secondary | ICD-10-CM

## 2020-08-16 DIAGNOSIS — L8990 Pressure ulcer of unspecified site, unspecified stage: Secondary | ICD-10-CM | POA: Diagnosis present

## 2020-08-16 HISTORY — PX: INCISION AND DRAINAGE ABSCESS: SHX5864

## 2020-08-16 LAB — PROTIME-INR
INR: 1.2 (ref 0.8–1.2)
Prothrombin Time: 14.9 seconds (ref 11.4–15.2)

## 2020-08-16 LAB — COMPREHENSIVE METABOLIC PANEL
ALT: 32 U/L (ref 0–44)
AST: 40 U/L (ref 15–41)
Albumin: 2.8 g/dL — ABNORMAL LOW (ref 3.5–5.0)
Alkaline Phosphatase: 79 U/L (ref 38–126)
Anion gap: 15 (ref 5–15)
BUN: 10 mg/dL (ref 6–20)
CO2: 24 mmol/L (ref 22–32)
Calcium: 9.1 mg/dL (ref 8.9–10.3)
Chloride: 93 mmol/L — ABNORMAL LOW (ref 98–111)
Creatinine, Ser: 0.98 mg/dL (ref 0.61–1.24)
GFR, Estimated: >60 mL/min (ref >60)
Glucose, Bld: 131 mg/dL — ABNORMAL HIGH (ref 70–99)
Potassium: 2.9 mmol/L — ABNORMAL LOW (ref 3.5–5.1)
Sodium: 132 mmol/L — ABNORMAL LOW (ref 135–145)
Total Bilirubin: 1 mg/dL (ref 0.3–1.2)
Total Protein: 7.7 g/dL (ref 6.5–8.1)

## 2020-08-16 LAB — RESP PANEL BY RT PCR (RSV, FLU A&B, COVID)
Influenza A by PCR: NEGATIVE
Influenza B by PCR: NEGATIVE
Respiratory Syncytial Virus by PCR: NEGATIVE
SARS Coronavirus 2 by RT PCR: NEGATIVE

## 2020-08-16 LAB — CBC WITH DIFFERENTIAL/PLATELET
Abs Immature Granulocytes: 0.28 10*3/uL — ABNORMAL HIGH (ref 0.00–0.07)
Basophils Absolute: 0.1 10*3/uL (ref 0.0–0.1)
Basophils Relative: 1 %
Eosinophils Absolute: 0 10*3/uL (ref 0.0–0.5)
Eosinophils Relative: 0 %
HCT: 36.2 % — ABNORMAL LOW (ref 39.0–52.0)
Hemoglobin: 12.5 g/dL — ABNORMAL LOW (ref 13.0–17.0)
Immature Granulocytes: 2 %
Lymphocytes Relative: 10 %
Lymphs Abs: 1.7 10*3/uL (ref 0.7–4.0)
MCH: 30.6 pg (ref 26.0–34.0)
MCHC: 34.5 g/dL (ref 30.0–36.0)
MCV: 88.5 fL (ref 80.0–100.0)
Monocytes Absolute: 2.1 10*3/uL — ABNORMAL HIGH (ref 0.1–1.0)
Monocytes Relative: 13 %
Neutro Abs: 12.4 10*3/uL — ABNORMAL HIGH (ref 1.7–7.7)
Neutrophils Relative %: 74 %
Platelets: 421 10*3/uL — ABNORMAL HIGH (ref 150–400)
RBC: 4.09 MIL/uL — ABNORMAL LOW (ref 4.22–5.81)
RDW: 12.3 % (ref 11.5–15.5)
WBC: 16.6 10*3/uL — ABNORMAL HIGH (ref 4.0–10.5)
nRBC: 0 % (ref 0.0–0.2)

## 2020-08-16 LAB — LACTIC ACID, PLASMA: Lactic Acid, Venous: 1.7 mmol/L (ref 0.5–1.9)

## 2020-08-16 LAB — MAGNESIUM: Magnesium: 2.3 mg/dL (ref 1.7–2.4)

## 2020-08-16 LAB — APTT: aPTT: 38 seconds — ABNORMAL HIGH (ref 24–36)

## 2020-08-16 SURGERY — INCISION AND DRAINAGE, ABSCESS
Anesthesia: General | Site: Perineum

## 2020-08-16 MED ORDER — CLINDAMYCIN PHOSPHATE 900 MG/50ML IV SOLN
900.0000 mg | Freq: Once | INTRAVENOUS | Status: AC
  Administered 2020-08-16: 900 mg via INTRAVENOUS
  Filled 2020-08-16: qty 50

## 2020-08-16 MED ORDER — MIDAZOLAM HCL 2 MG/2ML IJ SOLN
INTRAMUSCULAR | Status: AC
  Filled 2020-08-16: qty 2

## 2020-08-16 MED ORDER — LORAZEPAM 2 MG/ML IJ SOLN
0.0000 mg | Freq: Four times a day (QID) | INTRAMUSCULAR | Status: DC
  Administered 2020-08-17: 2 mg via INTRAVENOUS
  Filled 2020-08-16 (×2): qty 1

## 2020-08-16 MED ORDER — LACTATED RINGERS IV SOLN: INTRAVENOUS | Status: DC | PRN

## 2020-08-16 MED ORDER — ONDANSETRON HCL 4 MG/2ML IJ SOLN: 4.0000 mg | Freq: Four times a day (QID) | INTRAMUSCULAR | Status: DC | PRN

## 2020-08-16 MED ORDER — POTASSIUM CHLORIDE 10 MEQ/100ML IV SOLN
10.0000 meq | Freq: Once | INTRAVENOUS | Status: AC
  Administered 2020-08-17: 10 meq via INTRAVENOUS
  Filled 2020-08-16: qty 100

## 2020-08-16 MED ORDER — MORPHINE SULFATE (PF) 4 MG/ML IV SOLN
4.0000 mg | INTRAVENOUS | Status: DC | PRN
  Administered 2020-08-17 – 2020-08-25 (×24): 4 mg via INTRAVENOUS
  Filled 2020-08-16 (×25): qty 1

## 2020-08-16 MED ORDER — ADULT MULTIVITAMIN W/MINERALS CH
1.0000 | ORAL_TABLET | Freq: Every day | ORAL | Status: DC
  Administered 2020-08-17 – 2020-08-26 (×10): 1 via ORAL
  Filled 2020-08-16 (×10): qty 1

## 2020-08-16 MED ORDER — LORAZEPAM 2 MG/ML IJ SOLN
1.0000 mg | INTRAMUSCULAR | Status: DC | PRN
  Administered 2020-08-17: 2 mg via INTRAVENOUS

## 2020-08-16 MED ORDER — POLYETHYLENE GLYCOL 3350 17 G PO PACK: 17.0000 g | PACK | Freq: Every day | ORAL | Status: DC | PRN

## 2020-08-16 MED ORDER — ACETAMINOPHEN 500 MG PO TABS
1000.0000 mg | ORAL_TABLET | Freq: Once | ORAL | Status: DC
  Filled 2020-08-16: qty 2

## 2020-08-16 MED ORDER — LACTATED RINGERS IV SOLN: INTRAVENOUS | Status: DC

## 2020-08-16 MED ORDER — IOHEXOL 300 MG/ML  SOLN
100.0000 mL | Freq: Once | INTRAMUSCULAR | Status: AC | PRN
  Administered 2020-08-16: 100 mL via INTRAVENOUS

## 2020-08-16 MED ORDER — LACTATED RINGERS IV BOLUS (SEPSIS)
1000.0000 mL | Freq: Once | INTRAVENOUS | Status: AC
  Administered 2020-08-16: 1000 mL via INTRAVENOUS

## 2020-08-16 MED ORDER — SODIUM CHLORIDE 0.9 % IV SOLN
1.0000 g | Freq: Three times a day (TID) | INTRAVENOUS | Status: DC
  Administered 2020-08-17 – 2020-08-24 (×22): 1 g via INTRAVENOUS
  Filled 2020-08-16 (×2): qty 1
  Filled 2020-08-16: qty 3
  Filled 2020-08-16 (×23): qty 1

## 2020-08-16 MED ORDER — FENTANYL CITRATE (PF) 250 MCG/5ML IJ SOLN
INTRAMUSCULAR | Status: DC | PRN
Start: 2020-08-16 — End: 2020-08-17
  Administered 2020-08-16 (×5): 50 ug via INTRAVENOUS

## 2020-08-16 MED ORDER — LACTATED RINGERS IV SOLN: INTRAVENOUS | Status: AC

## 2020-08-16 MED ORDER — CLINDAMYCIN PHOSPHATE 600 MG/50ML IV SOLN
600.0000 mg | Freq: Three times a day (TID) | INTRAVENOUS | Status: DC
  Administered 2020-08-17 – 2020-08-19 (×7): 600 mg via INTRAVENOUS
  Filled 2020-08-16 (×7): qty 50

## 2020-08-16 MED ORDER — PROPOFOL 10 MG/ML IV BOLUS
INTRAVENOUS | Status: DC | PRN
  Administered 2020-08-16: 40 mg via INTRAVENOUS
  Administered 2020-08-16: 200 mg via INTRAVENOUS

## 2020-08-16 MED ORDER — POTASSIUM CHLORIDE CRYS ER 20 MEQ PO TBCR
40.0000 meq | EXTENDED_RELEASE_TABLET | Freq: Once | ORAL | Status: AC
  Administered 2020-08-16: 40 meq via ORAL
  Filled 2020-08-16: qty 2

## 2020-08-16 MED ORDER — FENTANYL CITRATE (PF) 250 MCG/5ML IJ SOLN
INTRAMUSCULAR | Status: AC
  Filled 2020-08-16: qty 5

## 2020-08-16 MED ORDER — LORAZEPAM 2 MG/ML IJ SOLN: 0.0000 mg | Freq: Two times a day (BID) | INTRAMUSCULAR | Status: DC

## 2020-08-16 MED ORDER — LIDOCAINE HCL (CARDIAC) PF 100 MG/5ML IV SOSY
PREFILLED_SYRINGE | INTRAVENOUS | Status: DC | PRN
  Administered 2020-08-16: 40 mg via INTRATRACHEAL

## 2020-08-16 MED ORDER — ACETAMINOPHEN 325 MG PO TABS: 650.0000 mg | ORAL_TABLET | Freq: Four times a day (QID) | ORAL | Status: DC | PRN

## 2020-08-16 MED ORDER — PIPERACILLIN-TAZOBACTAM 3.375 G IVPB: 3.3750 g | Freq: Three times a day (TID) | INTRAVENOUS | Status: DC

## 2020-08-16 MED ORDER — PIPERACILLIN-TAZOBACTAM 3.375 G IVPB 30 MIN
3.3750 g | Freq: Once | INTRAVENOUS | Status: AC
  Administered 2020-08-16: 3.375 g via INTRAVENOUS
  Filled 2020-08-16: qty 50

## 2020-08-16 MED ORDER — 0.9 % SODIUM CHLORIDE (POUR BTL) OPTIME
TOPICAL | Status: DC | PRN
  Administered 2020-08-16: 1000 mL

## 2020-08-16 MED ORDER — HYDROCODONE-ACETAMINOPHEN 5-325 MG PO TABS
1.0000 | ORAL_TABLET | ORAL | Status: DC | PRN
  Administered 2020-08-17 – 2020-08-23 (×20): 2 via ORAL
  Administered 2020-08-24 (×2): 1 via ORAL
  Administered 2020-08-24 – 2020-08-26 (×3): 2 via ORAL
  Filled 2020-08-16 (×9): qty 2
  Filled 2020-08-16: qty 1
  Filled 2020-08-16 (×11): qty 2
  Filled 2020-08-16: qty 1
  Filled 2020-08-16 (×3): qty 2

## 2020-08-16 MED ORDER — ACETAMINOPHEN 650 MG RE SUPP: 650.0000 mg | Freq: Four times a day (QID) | RECTAL | Status: DC | PRN

## 2020-08-16 MED ORDER — SODIUM CHLORIDE 0.9% FLUSH
3.0000 mL | Freq: Two times a day (BID) | INTRAVENOUS | Status: DC
  Administered 2020-08-17 – 2020-08-25 (×13): 3 mL via INTRAVENOUS

## 2020-08-16 MED ORDER — PROPOFOL 10 MG/ML IV BOLUS
INTRAVENOUS | Status: AC
  Filled 2020-08-16: qty 40

## 2020-08-16 MED ORDER — ONDANSETRON HCL 4 MG PO TABS: 4.0000 mg | ORAL_TABLET | Freq: Four times a day (QID) | ORAL | Status: DC | PRN

## 2020-08-16 MED ORDER — METHOCARBAMOL 1000 MG/10ML IJ SOLN
500.0000 mg | Freq: Four times a day (QID) | INTRAVENOUS | Status: DC | PRN
  Administered 2020-08-19 – 2020-08-23 (×4): 500 mg via INTRAVENOUS
  Filled 2020-08-16 (×6): qty 5

## 2020-08-16 MED ORDER — THIAMINE HCL 100 MG/ML IJ SOLN
100.0000 mg | Freq: Every day | INTRAMUSCULAR | Status: DC
  Filled 2020-08-16 (×5): qty 2

## 2020-08-16 MED ORDER — VANCOMYCIN HCL IN DEXTROSE 1-5 GM/200ML-% IV SOLN
1000.0000 mg | Freq: Once | INTRAVENOUS | Status: DC
  Filled 2020-08-16: qty 200

## 2020-08-16 MED ORDER — MIDAZOLAM HCL 5 MG/5ML IJ SOLN
INTRAMUSCULAR | Status: DC | PRN
  Administered 2020-08-16: 2 mg via INTRAVENOUS

## 2020-08-16 MED ORDER — FOLIC ACID 1 MG PO TABS
1.0000 mg | ORAL_TABLET | Freq: Every day | ORAL | Status: DC
  Administered 2020-08-17 – 2020-08-26 (×10): 1 mg via ORAL
  Filled 2020-08-16 (×10): qty 1

## 2020-08-16 MED ORDER — LORAZEPAM 1 MG PO TABS: 1.0000 mg | ORAL_TABLET | ORAL | Status: DC | PRN

## 2020-08-16 MED ORDER — ROCURONIUM BROMIDE 100 MG/10ML IV SOLN
INTRAVENOUS | Status: DC | PRN
  Administered 2020-08-16: 50 mg via INTRAVENOUS

## 2020-08-16 MED ORDER — VANCOMYCIN HCL 2000 MG/400ML IV SOLN
2000.0000 mg | Freq: Once | INTRAVENOUS | Status: AC
  Administered 2020-08-16: 2000 mg via INTRAVENOUS
  Filled 2020-08-16: qty 400

## 2020-08-16 MED ORDER — THIAMINE HCL 100 MG PO TABS
100.0000 mg | ORAL_TABLET | Freq: Every day | ORAL | Status: DC
  Administered 2020-08-17 – 2020-08-26 (×10): 100 mg via ORAL
  Filled 2020-08-16 (×10): qty 1

## 2020-08-16 MED ORDER — ALBUMIN HUMAN 5 % IV SOLN: INTRAVENOUS | Status: DC | PRN

## 2020-08-16 MED ORDER — VANCOMYCIN HCL IN DEXTROSE 1-5 GM/200ML-% IV SOLN
1000.0000 mg | Freq: Three times a day (TID) | INTRAVENOUS | Status: AC
  Administered 2020-08-17 – 2020-08-19 (×7): 1000 mg via INTRAVENOUS
  Filled 2020-08-16 (×8): qty 200

## 2020-08-16 MED ORDER — FENTANYL CITRATE (PF) 100 MCG/2ML IJ SOLN
50.0000 ug | Freq: Once | INTRAMUSCULAR | Status: AC
  Administered 2020-08-16: 50 ug via INTRAVENOUS
  Filled 2020-08-16: qty 2

## 2020-08-16 SURGICAL SUPPLY — 27 items
BAG URO CATCHER STRL LF (MISCELLANEOUS) ×3 IMPLANT
BLADE SURG 15 STRL LF DISP TIS (BLADE) ×2 IMPLANT
BLADE SURG 15 STRL SS (BLADE) ×3
BNDG GAUZE ELAST 4 BULKY (GAUZE/BANDAGES/DRESSINGS) ×2 IMPLANT
CANISTER SUCT 3000ML PPV (MISCELLANEOUS) ×3 IMPLANT
CNTNR URN SCR LID CUP LEK RST (MISCELLANEOUS) ×1 IMPLANT
CONT SPEC 4OZ STRL OR WHT (MISCELLANEOUS) ×3
COVER WAND RF STERILE (DRAPES) ×3 IMPLANT
DRSG PAD ABDOMINAL 8X10 ST (GAUZE/BANDAGES/DRESSINGS) ×6 IMPLANT
ELECT REM PT RETURN 9FT ADLT (ELECTROSURGICAL) ×3
ELECTRODE REM PT RTRN 9FT ADLT (ELECTROSURGICAL) ×1 IMPLANT
GAUZE SPONGE 4X4 12PLY STRL (GAUZE/BANDAGES/DRESSINGS) ×3 IMPLANT
GLOVE ORTHO TXT STRL SZ7.5 (GLOVE) ×3 IMPLANT
KIT BASIN OR (CUSTOM PROCEDURE TRAY) ×3 IMPLANT
KIT TURNOVER KIT B (KITS) ×3 IMPLANT
NS IRRIG 1000ML POUR BTL (IV SOLUTION) ×4 IMPLANT
PACK CYSTO (CUSTOM PROCEDURE TRAY) ×3 IMPLANT
PACK GENERAL/GYN (CUSTOM PROCEDURE TRAY) ×3 IMPLANT
PAD ARMBOARD 7.5X6 YLW CONV (MISCELLANEOUS) ×6 IMPLANT
SOL PREP POV-IOD 4OZ 10% (MISCELLANEOUS) ×3 IMPLANT
SPONGE LAP 18X18 RF (DISPOSABLE) ×4 IMPLANT
SUT CHROMIC 3 0 SH 27 (SUTURE) ×1 IMPLANT
SUT ETHILON 3 0 FSL (SUTURE) ×2 IMPLANT
SWAB COLLECTION DEVICE MRSA (MISCELLANEOUS) ×3 IMPLANT
TOWEL GREEN STERILE (TOWEL DISPOSABLE) ×3 IMPLANT
TOWEL GREEN STERILE FF (TOWEL DISPOSABLE) ×3 IMPLANT
WATER STERILE IRR 1000ML POUR (IV SOLUTION) ×3 IMPLANT
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
## 2020-08-16 NOTE — Progress Notes (Signed)
Pharmacy Antibiotic Note  Brandon Glass is a 39 y.o. 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  Pharmacy has been consulted to change Zosyn to Merrem.  Plan: Meropenem 1g IV Q8H.  Weight: 95.3 kg (210 lb)  Temp (24hrs), Avg:99.5 F (37.5 C), Min:98.6 F (37 C), Max:100.6 F (38.1 C)  Recent Labs  Lab 08/16/20 0909 08/16/20 2030  WBC 16.6*  --   CREATININE 0.98  --   LATICACIDVEN  --  1.7    Estimated Creatinine Clearance: 125.5 mL/min (by C-G formula based on SCr of 0.98 mg/dL).    No Known Allergies   Thank you for allowing pharmacy to be a part of this patient's care.  Vernard Gambles, PharmD, BCPS  08/16/2020 10:57 PM

## 2020-08-16 NOTE — Anesthesia Procedure Notes (Addendum)
Procedure Name: Intubation Date/Time: 08/16/2020 11:37 PM Performed by: Clovis Cao, CRNA Pre-anesthesia Checklist: Patient identified, Emergency Drugs available, Patient being monitored, Suction available and Timeout performed Patient Re-evaluated:Patient Re-evaluated prior to induction Oxygen Delivery Method: Circle system utilized Preoxygenation: Pre-oxygenation with 100% oxygen Induction Type: IV induction, Rapid sequence and Cricoid Pressure applied Laryngoscope Size: Mac and 3 Grade View: Grade II Tube type: Oral Tube size: 7.5 mm Number of attempts: 2 (DL x2 by CRNA with Sabra Heck 2- grade 3 view. No attempt to place ETT. DL x 1 by MDA with Mac 3- grade 2 view and easy placment of ETT. + ETCO2, = BBS. ) Airway Equipment and Method: Stylet Placement Confirmation: ETT inserted through vocal cords under direct vision,  positive ETCO2 and breath sounds checked- equal and bilateral Secured at: 23 cm Tube secured with: Tape Dental Injury: Teeth and Oropharynx as per pre-operative assessment
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
## 2020-08-16 NOTE — ED Notes (Signed)
Patient transported to CT 

## 2020-08-17 ENCOUNTER — Encounter (HOSPITAL_COMMUNITY): Payer: Self-pay | Admitting: Urology

## 2020-08-17 DIAGNOSIS — N493 Fournier gangrene: Secondary | ICD-10-CM | POA: Diagnosis not present

## 2020-08-17 LAB — URINALYSIS, ROUTINE W REFLEX MICROSCOPIC
Bacteria, UA: NONE SEEN
Bilirubin Urine: NEGATIVE
Glucose, UA: NEGATIVE mg/dL
Ketones, ur: 80 mg/dL — AB
Nitrite: NEGATIVE
Protein, ur: 30 mg/dL — AB
Specific Gravity, Urine: 1.034 — ABNORMAL HIGH (ref 1.005–1.030)
pH: 5 (ref 5.0–8.0)

## 2020-08-17 LAB — CBC
HCT: 30.7 % — ABNORMAL LOW (ref 39.0–52.0)
Hemoglobin: 10.7 g/dL — ABNORMAL LOW (ref 13.0–17.0)
MCH: 31.2 pg (ref 26.0–34.0)
MCHC: 34.9 g/dL (ref 30.0–36.0)
MCV: 89.5 fL (ref 80.0–100.0)
Platelets: 373 10*3/uL (ref 150–400)
RBC: 3.43 MIL/uL — ABNORMAL LOW (ref 4.22–5.81)
RDW: 12.7 % (ref 11.5–15.5)
WBC: 14.9 10*3/uL — ABNORMAL HIGH (ref 4.0–10.5)
nRBC: 0 % (ref 0.0–0.2)

## 2020-08-17 LAB — BASIC METABOLIC PANEL
Anion gap: 14 (ref 5–15)
BUN: 13 mg/dL (ref 6–20)
CO2: 22 mmol/L (ref 22–32)
Calcium: 8.4 mg/dL — ABNORMAL LOW (ref 8.9–10.3)
Chloride: 99 mmol/L (ref 98–111)
Creatinine, Ser: 1.03 mg/dL (ref 0.61–1.24)
GFR, Estimated: >60 mL/min (ref >60)
Glucose, Bld: 118 mg/dL — ABNORMAL HIGH (ref 70–99)
Potassium: 3.3 mmol/L — ABNORMAL LOW (ref 3.5–5.1)
Sodium: 135 mmol/L (ref 135–145)

## 2020-08-17 LAB — MAGNESIUM: Magnesium: 2.1 mg/dL (ref 1.7–2.4)

## 2020-08-17 LAB — HIV ANTIBODY (ROUTINE TESTING W REFLEX): HIV Screen 4th Generation wRfx: NONREACTIVE

## 2020-08-17 MED ORDER — CHLORHEXIDINE GLUCONATE CLOTH 2 % EX PADS
6.0000 | MEDICATED_PAD | Freq: Every day | CUTANEOUS | Status: DC
  Administered 2020-08-17 – 2020-08-25 (×7): 6 via TOPICAL

## 2020-08-17 MED ORDER — ROCURONIUM BROMIDE 10 MG/ML (PF) SYRINGE
PREFILLED_SYRINGE | INTRAVENOUS | Status: AC
  Filled 2020-08-17: qty 30

## 2020-08-17 MED ORDER — POTASSIUM CHLORIDE CRYS ER 20 MEQ PO TBCR
40.0000 meq | EXTENDED_RELEASE_TABLET | Freq: Once | ORAL | Status: AC
  Administered 2020-08-17: 40 meq via ORAL
  Filled 2020-08-17: qty 2

## 2020-08-17 MED ORDER — ONDANSETRON HCL 4 MG/2ML IJ SOLN
INTRAMUSCULAR | Status: AC
  Filled 2020-08-17: qty 2

## 2020-08-17 MED ORDER — LIDOCAINE 2% (20 MG/ML) 5 ML SYRINGE
INTRAMUSCULAR | Status: AC
  Filled 2020-08-17: qty 10

## 2020-08-17 MED ORDER — MIDAZOLAM HCL 2 MG/2ML IJ SOLN: 0.5000 mg | Freq: Once | INTRAMUSCULAR | Status: DC | PRN

## 2020-08-17 MED ORDER — HYDROMORPHONE HCL 1 MG/ML IJ SOLN
0.2500 mg | INTRAMUSCULAR | Status: DC | PRN
  Administered 2020-08-17 (×2): 0.5 mg via INTRAVENOUS

## 2020-08-17 MED ORDER — SUGAMMADEX SODIUM 200 MG/2ML IV SOLN
INTRAVENOUS | Status: DC | PRN
  Administered 2020-08-17: 200 mg via INTRAVENOUS

## 2020-08-17 MED ORDER — LACTATED RINGERS IV SOLN: INTRAVENOUS | Status: DC

## 2020-08-17 MED ORDER — PROMETHAZINE HCL 25 MG/ML IJ SOLN: 6.2500 mg | INTRAMUSCULAR | Status: DC | PRN

## 2020-08-17 MED ORDER — ARTIFICIAL TEARS OPHTHALMIC OINT
TOPICAL_OINTMENT | OPHTHALMIC | Status: AC
  Filled 2020-08-17: qty 3.5

## 2020-08-17 MED ORDER — HYDROMORPHONE HCL 1 MG/ML IJ SOLN
INTRAMUSCULAR | Status: AC
  Filled 2020-08-17: qty 1

## 2020-08-17 MED ORDER — OXYCODONE HCL 5 MG/5ML PO SOLN: 5.0000 mg | Freq: Once | ORAL | Status: DC | PRN

## 2020-08-17 MED ORDER — PHENYLEPHRINE 40 MCG/ML (10ML) SYRINGE FOR IV PUSH (FOR BLOOD PRESSURE SUPPORT)
PREFILLED_SYRINGE | INTRAVENOUS | Status: AC
  Filled 2020-08-17: qty 10

## 2020-08-17 MED ORDER — EPHEDRINE 5 MG/ML INJ
INTRAVENOUS | Status: AC
  Filled 2020-08-17: qty 10

## 2020-08-17 MED ORDER — ONDANSETRON HCL 4 MG/2ML IJ SOLN
INTRAMUSCULAR | Status: DC | PRN
  Administered 2020-08-17: 4 mg via INTRAVENOUS

## 2020-08-17 MED ORDER — OXYCODONE HCL 5 MG PO TABS: 5.0000 mg | ORAL_TABLET | Freq: Once | ORAL | Status: DC | PRN

## 2020-08-17 MED ORDER — MEPERIDINE HCL 25 MG/ML IJ SOLN: 6.2500 mg | INTRAMUSCULAR | Status: DC | PRN
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
## 2020-08-17 NOTE — Anesthesia Postprocedure Evaluation (Signed)
Anesthesia Post Note  Patient: [name]  Procedure(s) Performed: DEBRIDEMENT OF GANGRENE OF SCROTAL SKIN AND SUBCUTANEOUS TISSUE AND PLACEMENT OF FOLEY (N/A Perineum)     Patient location during evaluation: PACU Anesthesia Type: General Level of consciousness: awake and alert, patient cooperative and oriented Pain management: pain level controlled Vital Signs Assessment: post-procedure vital signs reviewed and stable Respiratory status: spontaneous breathing, nonlabored ventilation and respiratory function stable Cardiovascular status: blood pressure returned to baseline and stable Postop Assessment: no apparent nausea or vomiting Anesthetic complications: no   No complications documented.  Last Vitals:  Vitals:   08/17/20 0115 08/17/20 0126  BP: 115/75   Pulse: (!) 108 (!) 101  Resp: 13 15  Temp:  (!) 36.3 C  SpO2: 100% 100%    Last Pain:  Vitals:   08/17/20 0115  TempSrc:   PainSc: 6                  Neysa Arts,E. Janeal Abadi
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
## 2020-08-17 NOTE — Transfer of Care (Signed)
Immediate Anesthesia Transfer of Care Note  Patient: Brandon Glass  Procedure(s) Performed: DEBRIDEMENT OF GANGRENE OF SCROTAL SKIN AND SUBCUTANEOUS TISSUE AND PLACEMENT OF FOLEY (N/A Perineum)  Patient Location: PACU  Anesthesia Type:General  Level of Consciousness: awake  Airway & Oxygen Therapy: Patient Spontanous Breathing and Patient connected to face mask oxygen  Post-op Assessment: Report given to RN and Post -op Vital signs reviewed and stable  Post vital signs: Reviewed and stable  Last Vitals:  Vitals Value Taken Time  BP    Temp    Pulse 110 08/17/20 0049  Resp 16 08/17/20 0049  SpO2 100 % 08/17/20 0049  Vitals shown include unvalidated device data.  Last Pain:  Vitals:   08/16/20 2144  TempSrc: Rectal  PainSc:          Complications: No complications documented.
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
## 2020-08-18 DIAGNOSIS — N493 Fournier gangrene: Secondary | ICD-10-CM | POA: Diagnosis not present

## 2020-08-18 LAB — CBC
HCT: 26.1 % — ABNORMAL LOW (ref 39.0–52.0)
Hemoglobin: 8.9 g/dL — ABNORMAL LOW (ref 13.0–17.0)
MCH: 30.6 pg (ref 26.0–34.0)
MCHC: 34.1 g/dL (ref 30.0–36.0)
MCV: 89.7 fL (ref 80.0–100.0)
Platelets: 380 10*3/uL (ref 150–400)
RBC: 2.91 MIL/uL — ABNORMAL LOW (ref 4.22–5.81)
RDW: 12.9 % (ref 11.5–15.5)
WBC: 14.5 10*3/uL — ABNORMAL HIGH (ref 4.0–10.5)
nRBC: 0 % (ref 0.0–0.2)

## 2020-08-18 LAB — BASIC METABOLIC PANEL
Anion gap: 8 (ref 5–15)
BUN: 5 mg/dL — ABNORMAL LOW (ref 6–20)
CO2: 24 mmol/L (ref 22–32)
Calcium: 8 mg/dL — ABNORMAL LOW (ref 8.9–10.3)
Chloride: 101 mmol/L (ref 98–111)
Creatinine, Ser: 0.86 mg/dL (ref 0.61–1.24)
GFR, Estimated: >60 mL/min (ref >60)
Glucose, Bld: 121 mg/dL — ABNORMAL HIGH (ref 70–99)
Potassium: 4 mmol/L (ref 3.5–5.1)
Sodium: 133 mmol/L — ABNORMAL LOW (ref 135–145)

## 2020-08-18 LAB — SURGICAL PATHOLOGY

## 2020-08-18 MED ORDER — COLLAGENASE 250 UNIT/GM EX OINT
TOPICAL_OINTMENT | Freq: Every day | CUTANEOUS | Status: DC
  Filled 2020-08-18 (×2): qty 30
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
## 2020-08-18 NOTE — Progress Notes (Signed)
Discussed plan the plastic surgery team.  Fournier's gangrene: Continue with wet-to-dry dressings per urology, allow inflammation of soft tissue/scrotum to resolve prior to any plans for reconstruction, at least 1 week post debridement.  We will continue to follow and be available.  Ischial ulcer: Due to necrotic/nonviable tissue present within the ischial ulcer, recommend debridement.  Recommend consulting general surgery for evaluation, we will be available as needed for reconstruction after devitalized tissue has been excised.  Patient is a poor flap candidate due to the polysubstance abuse, smoking.  We will continue to follow and be available as needed for reconstruction.  Appreciate the consultation, recommend continuing with WOCN wound care recommendations
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
## 2020-08-19 DIAGNOSIS — N493 Fournier gangrene: Secondary | ICD-10-CM | POA: Diagnosis not present

## 2020-08-19 LAB — BASIC METABOLIC PANEL
Anion gap: 8 (ref 5–15)
BUN: <5 mg/dL — ABNORMAL LOW (ref 6–20)
CO2: 27 mmol/L (ref 22–32)
Calcium: 8.3 mg/dL — ABNORMAL LOW (ref 8.9–10.3)
Chloride: 100 mmol/L (ref 98–111)
Creatinine, Ser: 0.76 mg/dL (ref 0.61–1.24)
GFR, Estimated: >60 mL/min (ref >60)
Glucose, Bld: 103 mg/dL — ABNORMAL HIGH (ref 70–99)
Potassium: 3.9 mmol/L (ref 3.5–5.1)
Sodium: 135 mmol/L (ref 135–145)

## 2020-08-19 LAB — CBC
HCT: 27.5 % — ABNORMAL LOW (ref 39.0–52.0)
Hemoglobin: 9.3 g/dL — ABNORMAL LOW (ref 13.0–17.0)
MCH: 30.4 pg (ref 26.0–34.0)
MCHC: 33.8 g/dL (ref 30.0–36.0)
MCV: 89.9 fL (ref 80.0–100.0)
Platelets: 432 10*3/uL — ABNORMAL HIGH (ref 150–400)
RBC: 3.06 MIL/uL — ABNORMAL LOW (ref 4.22–5.81)
RDW: 13.1 % (ref 11.5–15.5)
WBC: 10.4 10*3/uL (ref 4.0–10.5)
nRBC: 0 % (ref 0.0–0.2)

## 2020-08-19 LAB — VANCOMYCIN, TROUGH
Vancomycin Tr: 27 ug/mL (ref 15–20)
Vancomycin Tr: 11 ug/mL — ABNORMAL LOW (ref 15–20)

## 2020-08-19 MED ORDER — VANCOMYCIN HCL 1250 MG/250ML IV SOLN
1250.0000 mg | Freq: Three times a day (TID) | INTRAVENOUS | Status: DC
  Administered 2020-08-19 – 2020-08-24 (×14): 1250 mg via INTRAVENOUS
  Filled 2020-08-19 (×17): qty 250

## 2020-08-19 NOTE — TOC Progression Note (Addendum)
Transition of Care [facility]) - Progression Note    Patient Details  Name: Brandon Glass MRN: [id number] Date of Birth: 04-Oct-1981  Transition of Care [facility]) CM/SW Contact  Nadene Rubins Adria Devon, RN Phone Number: 08/19/2020, 11:18 AM  Clinical Narrative:     See previous note. Patient lives in hotel room, brother close by.   Patient will need wound care at discharge.  Offered substance abuse resources , patient declined at this time.  Discussed SNF placement. Patient has medicaid and would need to agree to stay at SNF 30 days , also he would need to agree to give disability cheque to SNF for any services not covered by Medicaid.   Patient states he is still waiting to get his disability cheque and once he does he cannot give it to SNF.   Patient is going to talk to his mother to see if she would be willing to come to the hospital for education in  wound care management. Patient believes she   Would be available and willing to assist with dressings daily.   NCM will start calling home health agencies to see if [facility] is available. Patient understands if [facility] is arranged HHRN would not be able to make daily visits.    Left Eber Lyttle with [facility] a message . Eber Derderian unable to take due to staffing. Possible to take after next week.  Amy with [facility] unable to take due to staffing   Erin with [facility] unable to accept referral due to staffing.   Left message for [facility] with Frances Furbish . Kandee Keen unable to take referral due to staffing.  Called [facility] will review clinicals , requesting clinicals be sent to [phone] fax sent.  [place] with [facility] will review clinicals. Unable to accept at present due to staffing , possible could take next week.  Okey Regal with [facility] unable to accept referral due to insurance.  Expected Discharge Plan: Home w Home Health Services Barriers to Discharge: Continued Medical Work up  Expected Discharge Plan and  Services Expected Discharge Plan: Home w Home Health Services   Discharge Planning Services: CM Consult Post Acute Care Choice: Home Health Living arrangements for the past 2 months: Hotel/Motel                 DME Arranged: N/A         HH Arranged: RN           Social Determinants of Health (SDOH) Interventions    Readmission Risk Interventions No flowsheet data found.

## 2020-08-19 NOTE — Progress Notes (Signed)
Discussed plan with plastic surgery team. In regards to the patient's scrotal wound we remain available as needed.  Will allow scrotal wound/perineum edema and inflammation to improve prior to any reconstruction. If patient is deemed stable for discharge prior to week of November 1-5 per primary team and patient would like to discuss reconstructive options, recommend following up in our office see Dr. Arita Miss for consultation and further discussion of reconstructive options on November 3 or 4.   In regards to wound care of the scrotal wound, recommend continuing with urology and WOCN recommendations.  Appreciate the consultation, will continue to follow while patient is hospitalized.  Please call with any questions or concerns.

## 2020-08-19 NOTE — Progress Notes (Signed)
3 Days Post-Op Subjective: NAEO.  The patient is resting comfortably in bed with no complaints.  Objective: Vital signs in last 24 hours: Temp:  [98.2 F (36.8 C)-98.5 F (36.9 C)] 98.2 F (36.8 C) (10/22 1412) Pulse Rate:  [77-91] 77 (10/22 1412) Resp:  [16-18] 16 (10/22 1412) BP: (103-116)/(60-74) 116/74 (10/22 1412) SpO2:  [97 %-100 %] 97 % (10/22 1412)  Intake/Output from previous day: 10/21 0701 - 10/22 0700 In: 1530 [P.O.:1180; IV Piggyback:350] Out: 1900 [Urine:1900]  Intake/Output this shift: Total I/O In: 280 [P.O.:280] Out: -   Physical Exam:  General: Alert and oriented Gu: The surgical wounds involving the right hemiscrotum and right inguinal crease exhibit excellent granulation tissue with no evidence of skin necrosis or significant purulent debris. Ext: NT, No erythema  Lab Results: Recent Labs    08/17/20 0146 08/18/20 0244 08/19/20 0132  HGB 10.7* 8.9* 9.3*  HCT 30.7* 26.1* 27.5*   BMET Recent Labs    08/18/20 0244 08/19/20 0132  NA 133* 135  K 4.0 3.9  CL 101 100  CO2 24 27  GLUCOSE 121* 103*  BUN 5* <5*  CREATININE 0.86 0.76  CALCIUM 8.0* 8.3*     Studies/Results: No results found.  Assessment/Plan: 39 year old male with Fournier's gangrene, postop day 3 status post right inguinal and right scrotal debridement  -Continue daily wet-to-dry dressing changes.  Plastic surgery is not available for wound closure until the beginning of November.  Arrangements will need to be made for daily wound care as an outpatient.  The patient states that he is currently living in a hotel. -Will continue to monitor.    LOS: 3 days   Rhoderick Moody, MD [facility] Pager: [phone]  08/19/2020, 3:56 PM

## 2020-08-19 NOTE — Progress Notes (Signed)
Pharmacy Antibiotic Note  Brandon Glass is a 39 y.o. 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  S/p I&D on 08/17/20.  Pharmacy has been consulted for vancomycin and Merrem dosing.  Renal function stable, now afebrile and WBC normalized.  Vancomycin trough of 27 mcg/mL was drawn 1.5 hours after the previous dose, so it is inaccurate.  Vancomycin trough of 11 mcg/mL was drawn 1.5 hours late, so true trough is slightly higher.  Plan: Increase vanc to 1250mg  IV Q8H for goal trough of 15-20 mcg/mL Merrem 1g IV Q8H Clinda 600mg  IV Q8H Monitor renal fxn, clinical progress, vanc trough at new Css  Weight: 95.3 kg (210 lb)  Temp (24hrs), Avg:98.4 F (36.9 C), Min:98.4 F (36.9 C), Max:98.5 F (36.9 C)  Recent Labs  Lab 08/16/20 0909 08/16/20 2030 08/17/20 0146 08/18/20 0244 08/19/20 0132 08/19/20 0657  WBC 16.6*  --  14.9* 14.5* 10.4  --   CREATININE 0.98  --  1.03 0.86 0.76  --   LATICACIDVEN  --  1.7  --   --   --   --   VANCOTROUGH  --   --   --   --  27* 11*    Estimated Creatinine Clearance: 153.7 mL/min (by C-G formula based on SCr of 0.76 mg/dL).    No Known Allergies  Zosyn x1 10/19 Merrem 10/19 >> Clinda 10/19 >> Vanc 10/19 >>  10/22 VT (0130) = 27 mcg/mL (drawn 1.5 hrs post infusion) 10/22 VT (0700) = 11 mcg/mL (drawn 1.5 hrs late) on 1g q8 >> 1250mg  q8  10/19 BCx - NGTD 10/20 UCx -   Wayman Hoard D. 11/19, PharmD, BCPS, BCCCP 08/19/2020, 8:31 AM

## 2020-08-19 NOTE — Progress Notes (Signed)
Physical Therapy Wound Treatment Patient Details  Name: Brandon Glass MRN: [id number] Date of Birth: Nov 16, 1980  Today's Date: 08/19/2020 Time: 0920-1008 Time Calculation (min): 48 min  Subjective  Subjective: "I told the nurse I was wet at 6:45 and she told me they were going to come in with hydrotherapy and clean me up." Patient and Family Stated Goals: for wounds to heal, pressure relief Date of Onset:  (unknown) Prior Treatments: dressing change  Pain Score:  6/10   Wound Assessment  Pressure Injury 08/17/20 Ischial tuberosity Right Stage 4 - Full thickness tissue loss with exposed bone, tendon or muscle. (Active)  Wound Image   08/19/20 1056  Dressing Type ABD;Barrier Film (skin prep);Gauze (Comment);Moist to moist 08/19/20 1056  Dressing Changed;Clean;Dry;Intact 08/19/20 1056  Dressing Change Frequency Twice a day 08/19/20 1056  State of Healing Early/partial granulation 08/19/20 1056  Site / Wound Assessment Red;Black;Yellow;Bleeding;Friable 08/19/20 1056  % Wound base Red or Granulating 60% 08/19/20 1056  % Wound base Yellow/Fibrinous Exudate 10% 08/19/20 1056  % Wound base Black/Eschar 30% 08/19/20 1056  % Wound base Other/Granulation Tissue (Comment) 5% 08/19/20 1056  Peri-wound Assessment Maceration 08/19/20 1056  Wound Length (cm) 5.5 cm 08/19/20 1056  Wound Width (cm) 6 cm 08/19/20 1056  Wound Depth (cm) 4 cm 08/19/20 1056  Wound Surface Area (cm^2) 33 cm^2 08/19/20 1056  Wound Volume (cm^3) 132 cm^3 08/19/20 1056  Tunneling (cm) 9 08/19/20 1056  Margins Unattached edges (unapproximated) 08/19/20 1056  Drainage Amount Moderate 08/19/20 1056  Drainage Description Serosanguineous 08/19/20 1056  Treatment Debridement (Selective);Hydrotherapy (Pulse lavage);Packing (Saline gauze) 08/19/20 1056   Hydrotherapy Pulsed lavage therapy - wound location: right ischium Pulsed Lavage with Suction (psi): 12 psi Pulsed Lavage with Suction - Normal Saline Used: 1000 mL Pulsed  Lavage Tip: Tip with splash shield Selective Debridement Selective Debridement - Location: right ischium Selective Debridement - Tools Used: Scalpel;Scissors Selective Debridement - Tissue Removed: black eschar   Wound Assessment and Plan  Wound Therapy - Assess/Plan/Recommendations Wound Therapy - Clinical Statement: Pt presents to hydrotherapy with right ischial pressure injury. Education provided to patient regarding pressure relief schedule. Santyl not present this date so instructed RN to call Pharmacy and apply once obtained. Also ordered air mattress bed for patient. Pt will benefit from hydrotherapy for selective debridement and to decrease bioburden. Wound Therapy - Functional Problem List: immobility, sensation impairment Factors Delaying/Impairing Wound Healing: Altered sensation Hydrotherapy Plan: Debridement;Patient/family education;Pulsatile lavage with suction Wound Therapy - Frequency: 6X / week Wound Therapy - Follow Up Recommendations: Home health RN Wound Plan: see above  Wound Therapy Goals- Improve the function of patient's integumentary system by progressing the wound(s) through the phases of wound healing (inflammation - proliferation - remodeling) by: Decrease Necrotic Tissue to: 20 Decrease Necrotic Tissue - Progress: Goal set today Increase Granulation Tissue to: 80 Increase Granulation Tissue - Progress: Goal set today Goals/treatment plan/discharge plan were made with and agreed upon by patient/family: Yes Time For Goal Achievement: 7 days Wound Therapy - Potential for Goals: Good  Goals will be updated until maximal potential achieved or discharge criteria met.  Discharge criteria: when goals achieved, discharge from hospital, MD decision/surgical intervention, no progress towards goals, refusal/missing three consecutive treatments without notification or medical reason.  GP  Wyona Almas, PT, DPT Acute Rehabilitation Services Pager [phone] Office  [phone]      Deno Etienne 08/19/2020, 11:04 AM
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
## 2020-08-20 DIAGNOSIS — N493 Fournier gangrene: Secondary | ICD-10-CM | POA: Diagnosis not present
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
## 2020-08-20 NOTE — Progress Notes (Signed)
Physical Therapy Wound Treatment Patient Details  Name: Brandon Glass MRN: [id number] Date of Birth: Aug 23, 1981  Today's Date: 08/20/2020 Time: 9150-5697 Time Calculation (min): 31 min  Subjective  Subjective: Agreeable to hydrotherapy, received air mattress yesterday Patient and Family Stated Goals: for wounds to heal, pressure relief Date of Onset:  (unknown) Prior Treatments: dressing change  Pain Score: 4/10   Wound Assessment  Pressure Injury 08/17/20 Ischial tuberosity Right Stage 4 - Full thickness tissue loss with exposed bone, tendon or muscle. (Active)  Dressing Type ABD;Gauze (Comment);Moist to dry 08/20/20 1447  Dressing Changed 08/20/20 1447  Dressing Change Frequency Twice a day 08/20/20 1447  State of Healing Early/partial granulation 08/20/20 1447  Site / Wound Assessment Red;Black;Yellow;Bleeding;Friable 08/20/20 1447  % Wound base Red or Granulating 60% 08/20/20 1447  % Wound base Yellow/Fibrinous Exudate 10% 08/20/20 1447  % Wound base Black/Eschar 30% 08/20/20 1447  % Wound base Other/Granulation Tissue (Comment) 0% 08/20/20 1447  Peri-wound Assessment Maceration 08/20/20 1447  Wound Length (cm) 5.5 cm 08/19/20 1056  Wound Width (cm) 6 cm 08/19/20 1056  Wound Depth (cm) 4 cm 08/19/20 1056  Wound Surface Area (cm^2) 33 cm^2 08/19/20 1056  Wound Volume (cm^3) 132 cm^3 08/19/20 1056  Tunneling (cm) 9 08/19/20 1056  Margins Unattached edges (unapproximated) 08/20/20 1447  Drainage Amount Moderate 08/20/20 1447  Drainage Description Serosanguineous 08/20/20 1447  Treatment Debridement (Selective);Hydrotherapy (Pulse lavage);Packing (Saline gauze) 08/20/20 1447   Santyl applied to wound bed prior to applying dressing.    Hydrotherapy Pulsed lavage therapy - wound location: right ischium Pulsed Lavage with Suction (psi): 12 psi Pulsed Lavage with Suction - Normal Saline Used: 1000 mL Pulsed Lavage Tip: Tip with splash shield Selective Debridement Selective  Debridement - Location: right ischium Selective Debridement - Tools Used: Scalpel;Scissors Selective Debridement - Tissue Removed: black eschar, yellow slough   Wound Assessment and Plan  Wound Therapy - Assess/Plan/Recommendations Wound Therapy - Clinical Statement: Wound appears to be improving and suspect will progress well with continued selective debridement and Santyl use. Right ischial wound and back of scrotal wound do connect via tunneling. Pt will benefit from hydrotherapy for selective debridement and to decrease bioburden. Wound Therapy - Functional Problem List: immobility, sensation impairment Factors Delaying/Impairing Wound Healing: Altered sensation Hydrotherapy Plan: Debridement;Patient/family education;Pulsatile lavage with suction Wound Therapy - Frequency: 6X / week Wound Therapy - Follow Up Recommendations: Home health RN Wound Plan: see above  Wound Therapy Goals- Improve the function of patient's integumentary system by progressing the wound(s) through the phases of wound healing (inflammation - proliferation - remodeling) by: Decrease Necrotic Tissue to: 20 Decrease Necrotic Tissue - Progress: Progressing toward goal Increase Granulation Tissue to: 80 Increase Granulation Tissue - Progress: Progressing toward goal Goals/treatment plan/discharge plan were made with and agreed upon by patient/family: Yes Time For Goal Achievement: 7 days Wound Therapy - Potential for Goals: Good  Goals will be updated until maximal potential achieved or discharge criteria met.  Discharge criteria: when goals achieved, discharge from hospital, MD decision/surgical intervention, no progress towards goals, refusal/missing three consecutive treatments without notification or medical reason.  GP    Wyona Almas, PT, DPT Acute Rehabilitation Services Pager [phone] Office [phone]  Deno Etienne 08/20/2020, 2:51 PM

## 2020-08-20 NOTE — Progress Notes (Signed)
4 Days Post-Op Subjective: Patient is resting comfortably in bed with no complaints  Objective: Vital signs in last 24 hours: Temp:  [97.8 F (36.6 C)-99.1 F (37.3 C)] 99.1 F (37.3 C) (10/23 1318) Pulse Rate:  [71] 71 (10/23 1318) Resp:  [14-17] 16 (10/23 1318) BP: (104-124)/(62-73) 124/73 (10/23 1318) SpO2:  [99 %-100 %] 100 % (10/23 1318)  Intake/Output from previous day: 10/22 0701 - 10/23 0700 In: 280 [P.O.:280] Out: -   Intake/Output this shift: Total I/O In: 300 [P.O.:300] Out: 1400 [Urine:1400]  Physical Exam:  General: Alert and oriented Gu: The surgical wounds involving the right hemiscrotum and right inguinal crease exhibit excellent granulation tissue with no evidence of skin necrosis or significant purulent debris Ext: NT, No erythema  Lab Results: Recent Labs    08/18/20 0244 08/19/20 0132  HGB 8.9* 9.3*  HCT 26.1* 27.5*   BMET Recent Labs    08/18/20 0244 08/19/20 0132  NA 133* 135  K 4.0 3.9  CL 101 100  CO2 24 27  GLUCOSE 121* 103*  BUN 5* <5*  CREATININE 0.86 0.76  CALCIUM 8.0* 8.3*     Studies/Results: No results found.  Assessment/Plan: 39 year old male with Fournier's gangrene, postop day 4 status post right inguinal and right scrotal debridement  -Continue wet-to-dry dressing changes.  Plan for wound closure/skin grafting with plastic surgery in 1 to 2 weeks.   LOS: 4 days   Rhoderick Moody, MD [facility] Pager: [phone]  08/20/2020, 5:01 PM

## 2020-08-21 DIAGNOSIS — N493 Fournier gangrene: Secondary | ICD-10-CM | POA: Diagnosis not present

## 2020-08-21 LAB — CBC
HCT: 30.7 % — ABNORMAL LOW (ref 39.0–52.0)
Hemoglobin: 10 g/dL — ABNORMAL LOW (ref 13.0–17.0)
MCH: 29.9 pg (ref 26.0–34.0)
MCHC: 32.6 g/dL (ref 30.0–36.0)
MCV: 91.6 fL (ref 80.0–100.0)
Platelets: 544 10*3/uL — ABNORMAL HIGH (ref 150–400)
RBC: 3.35 MIL/uL — ABNORMAL LOW (ref 4.22–5.81)
RDW: 13.2 % (ref 11.5–15.5)
WBC: 7.6 10*3/uL (ref 4.0–10.5)
nRBC: 0 % (ref 0.0–0.2)

## 2020-08-21 LAB — COMPREHENSIVE METABOLIC PANEL
ALT: 19 U/L (ref 0–44)
AST: 23 U/L (ref 15–41)
Albumin: 2.3 g/dL — ABNORMAL LOW (ref 3.5–5.0)
Alkaline Phosphatase: 51 U/L (ref 38–126)
Anion gap: 6 (ref 5–15)
BUN: 7 mg/dL (ref 6–20)
CO2: 27 mmol/L (ref 22–32)
Calcium: 8.7 mg/dL — ABNORMAL LOW (ref 8.9–10.3)
Chloride: 103 mmol/L (ref 98–111)
Creatinine, Ser: 0.72 mg/dL (ref 0.61–1.24)
GFR, Estimated: >60 mL/min (ref >60)
Glucose, Bld: 101 mg/dL — ABNORMAL HIGH (ref 70–99)
Potassium: 4.8 mmol/L (ref 3.5–5.1)
Sodium: 136 mmol/L (ref 135–145)
Total Bilirubin: 0.8 mg/dL (ref 0.3–1.2)
Total Protein: 6.2 g/dL — ABNORMAL LOW (ref 6.5–8.1)

## 2020-08-21 LAB — CULTURE, BLOOD (ROUTINE X 2)
Culture: NO GROWTH
Culture: NO GROWTH
Special Requests: ADEQUATE
Special Requests: ADEQUATE

## 2020-08-21 LAB — PHOSPHORUS: Phosphorus: 4.2 mg/dL (ref 2.5–4.6)

## 2020-08-21 LAB — MAGNESIUM: Magnesium: 2 mg/dL (ref 1.7–2.4)
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
## 2020-08-21 NOTE — Progress Notes (Signed)
The patient has no complaints.  Right inguinal and scrotal wounds are healing appropriately.   -Continue daily wet to dry dressing changes

## 2020-08-22 ENCOUNTER — Ambulatory Visit (INDEPENDENT_AMBULATORY_CARE_PROVIDER_SITE_OTHER): Payer: Medicaid Other | Admitting: Primary Care

## 2020-08-22 DIAGNOSIS — N493 Fournier gangrene: Secondary | ICD-10-CM | POA: Diagnosis not present

## 2020-08-22 LAB — CBC
HCT: 31.1 % — ABNORMAL LOW (ref 39.0–52.0)
Hemoglobin: 10.2 g/dL — ABNORMAL LOW (ref 13.0–17.0)
MCH: 29.8 pg (ref 26.0–34.0)
MCHC: 32.8 g/dL (ref 30.0–36.0)
MCV: 90.9 fL (ref 80.0–100.0)
Platelets: 564 10*3/uL — ABNORMAL HIGH (ref 150–400)
RBC: 3.42 MIL/uL — ABNORMAL LOW (ref 4.22–5.81)
RDW: 13.4 % (ref 11.5–15.5)
WBC: 7.9 10*3/uL (ref 4.0–10.5)
nRBC: 0 % (ref 0.0–0.2)

## 2020-08-22 LAB — COMPREHENSIVE METABOLIC PANEL
ALT: 24 U/L (ref 0–44)
AST: 31 U/L (ref 15–41)
Albumin: 2.5 g/dL — ABNORMAL LOW (ref 3.5–5.0)
Alkaline Phosphatase: 48 U/L (ref 38–126)
Anion gap: 8 (ref 5–15)
BUN: 9 mg/dL (ref 6–20)
CO2: 25 mmol/L (ref 22–32)
Calcium: 9.1 mg/dL (ref 8.9–10.3)
Chloride: 104 mmol/L (ref 98–111)
Creatinine, Ser: 0.75 mg/dL (ref 0.61–1.24)
GFR, Estimated: >60 mL/min (ref >60)
Glucose, Bld: 99 mg/dL (ref 70–99)
Potassium: 4.5 mmol/L (ref 3.5–5.1)
Sodium: 137 mmol/L (ref 135–145)
Total Bilirubin: 0.8 mg/dL (ref 0.3–1.2)
Total Protein: 6.3 g/dL — ABNORMAL LOW (ref 6.5–8.1)

## 2020-08-22 LAB — VANCOMYCIN, TROUGH: Vancomycin Tr: 21 ug/mL (ref 15–20)

## 2020-08-22 LAB — PHOSPHORUS: Phosphorus: 3.8 mg/dL (ref 2.5–4.6)

## 2020-08-22 LAB — MAGNESIUM: Magnesium: 2.1 mg/dL (ref 1.7–2.4)

## 2020-08-22 NOTE — Final Consult Note (Signed)
Consultant Final Sign-Off Note    Assessment/Final recommendations  Brandon Glass is a 39 y.o. male followed by me for Right Ischial Decubitus Ulcer - No indication for emergent operative debridement.  - Prior area of necrotic tissue responded well to enzymatic debridement with Santyl BID dressing changes along with Hydrotherapy by PT. Can switch to WTD BID and d/c Hydrotherapy from our standpoint - Cont air mattress to help alleviate the pressure form the wound - Thank you for allowing [place] to participate in the care of your patient.  Please call with any questions or concerns.   Wound care (if applicable): Cleanse with NS, pat gently dry. Fill defects with saline moistened roll gauze, top with dry gauze, ABD pads and secure with tape. Change PRN for soiling, otherwise twice daily.   Diet at discharge: per primary team   Activity at discharge: per primary team   Follow-up appointment:  Plastics    Pending results:  Unresulted Labs (From admission, onward)          Start     Ordered   08/22/20 1530  Vancomycin, trough  Once-Timed,   TIMED        08/22/20 0836   08/16/20 1919  Urine culture  (Septic presentation on arrival (screening labs, nursing and treatment orders for obvious sepsis))  ONCE - STAT,   STAT        08/16/20 1921           Medication recommendations:   Other recommendations:    Elmer Sow [facility] 08/22/2020 8:46 AM    Subjective   Doing well. Continues to have some pain over the area. Tolerating dressing changes.   Objective  Vital signs in last 24 hours: Temp:  [98 F (36.7 C)-98.1 F (36.7 C)] 98 F (36.7 C) (10/25 0514) Pulse Rate:  [56-77] 56 (10/25 0514) Resp:  [18-20] 20 (10/25 0514) BP: (103-110)/(51-77) 110/51 (10/25 0514) SpO2:  [97 %-100 %] 100 % (10/25 0514)  Wound: There is a 5.5cm (l) x 6cm (w) x 4cm (d) wound on the right buttock/ischium. There is ~2cm of undermining caudally. The wound is with > 75% area of healthy appearing  granulation tissue. The deeper portion of the previous necrotic tissue appears to have sloughed off/been debrided. There is no drainage from the wound. The periwound is with some macerated tissue cephalad as noted in the picture below. There is no skin erythema, heat, induration, fluctuance or drainage from the wound. The open wound from the Urology procedure was packed & dressed and examination was deferred. A chaperone was present during this exam.   Picture to orientate location of wound      Pertinent labs and Studies: Recent Labs    08/21/20 0136 08/22/20 0416  WBC 7.6 7.9  HGB 10.0* 10.2*  HCT 30.7* 31.1*   BMET Recent Labs    08/21/20 0136 08/22/20 0416  NA 136 137  K 4.8 4.5  CL 103 104  CO2 27 25  GLUCOSE 101* 99  BUN 7 9  CREATININE 0.72 0.75  CALCIUM 8.7* 9.1   No results for input(s): LABURIN in the last 72 hours. Results for orders placed or performed during the hospital encounter of 08/16/20  Blood Culture (routine x 2)     Status: None   Collection Time: 08/16/20  7:41 PM   Specimen: BLOOD RIGHT FOREARM  Result Value Ref Range Status   Specimen Description BLOOD RIGHT FOREARM  Final   Special Requests   Final  BOTTLES DRAWN AEROBIC AND ANAEROBIC Blood Culture adequate volume   Culture   Final    NO GROWTH 5 DAYS Performed at [facility], [address], [place], [place] [address]    Report Status 08/21/2020 FINAL  Final  Blood Culture (routine x 2)     Status: None   Collection Time: 08/16/20  8:00 PM   Specimen: BLOOD LEFT FOREARM  Result Value Ref Range Status   Specimen Description BLOOD LEFT FOREARM  Final   Special Requests   Final    BOTTLES DRAWN AEROBIC AND ANAEROBIC Blood Culture adequate volume   Culture   Final    NO GROWTH 5 DAYS Performed at [facility], [address] [place], [place] [address]    Report Status 08/21/2020 FINAL  Final  Resp Panel by RT PCR (RSV, Flu A&B, Covid) - Nasopharyngeal Swab     Status:  None   Collection Time: 08/16/20  8:00 PM   Specimen: Nasopharyngeal Swab  Result Value Ref Range Status   SARS Coronavirus 2 by RT PCR NEGATIVE NEGATIVE Final    Comment: (NOTE) SARS-CoV-2 target nucleic acids are NOT DETECTED.  The SARS-CoV-2 RNA is generally detectable in upper respiratoy specimens during the acute phase of infection. The lowest concentration of SARS-CoV-2 viral copies this assay can detect is 131 copies/mL. A negative result does not preclude SARS-Cov-2 infection and should not be used as the sole basis for treatment or other patient management decisions. A negative result may occur with  improper specimen collection/handling, submission of specimen other than nasopharyngeal swab, presence of viral mutation(s) within the areas targeted by this assay, and inadequate number of viral copies (<131 copies/mL). A negative result must be combined with clinical observations, patient history, and epidemiological information. The expected result is Negative.  Fact Sheet for Patients:  https://www.moore.com/  Fact Sheet for Healthcare Providers:  https://www.young.biz/  This test is no t yet approved or cleared by the [place] FDA and  has been authorized for detection and/or diagnosis of SARS-CoV-2 by FDA under an Emergency Use Authorization (EUA). This EUA will remain  in effect (meaning this test can be used) for the duration of the COVID-19 declaration under Section 564(b)(1) of the Act, 21 U.S.C. section 360bbb-3(b)(1), unless the authorization is terminated or revoked sooner.     Influenza A by PCR NEGATIVE NEGATIVE Final   Influenza B by PCR NEGATIVE NEGATIVE Final    Comment: (NOTE) The Xpert Xpress SARS-CoV-2/FLU/RSV assay is intended as an aid in  the diagnosis of influenza from Nasopharyngeal swab specimens and  should not be used as a sole basis for treatment. Nasal washings and  aspirates are unacceptable for  Xpert Xpress SARS-CoV-2/FLU/RSV  testing.  Fact Sheet for Patients: https://www.moore.com/  Fact Sheet for Healthcare Providers: https://www.young.biz/  This test is not yet approved or cleared by the [place] FDA and  has been authorized for detection and/or diagnosis of SARS-CoV-2 by  FDA under an Emergency Use Authorization (EUA). This EUA will remain  in effect (meaning this test can be used) for the duration of the  Covid-19 declaration under Section 564(b)(1) of the Act, 21  U.S.C. section 360bbb-3(b)(1), unless the authorization is  terminated or revoked.    Respiratory Syncytial Virus by PCR NEGATIVE NEGATIVE Final    Comment: (NOTE) Fact Sheet for Patients: https://www.moore.com/  Fact Sheet for Healthcare Providers: https://www.young.biz/  This test is not yet approved or cleared by the [place] and  has been authorized for detection and/or diagnosis of SARS-CoV-2 by  FDA under an Emergency Use Authorization (EUA). This EUA will remain  in effect (meaning this test can be used) for the duration of the  COVID-19 declaration under Section 564(b)(1) of the Act, 21 U.S.C.  section 360bbb-3(b)(1), unless the authorization is terminated or  revoked. Performed at [facility], [address], [place], [place] [address]     Imaging: No results found.

## 2020-08-22 NOTE — Progress Notes (Signed)
Physical Therapy Wound Treatment Patient Details  Name: Brandon Glass MRN: [id number] Date of Birth: 1981/10/16  Today's Date: 08/22/2020 Time: 2376-2831 Time Calculation (min): 35 min  Subjective  Subjective: Pleasant and agreeable to hydrotherapy.  Patient and Family Stated Goals: for wounds to heal, pressure relief Date of Onset:  (unknown) Prior Treatments: dressing changes  Pain Score:  Premedicated and tolerated well with no complaints of pain.   Wound Assessment  Pressure Injury 08/17/20 Ischial tuberosity Right Stage 4 - Full thickness tissue loss with exposed bone, tendon or muscle. (Active)  Dressing Type ABD;Barrier Film (skin prep);Gauze (Comment);Moist to dry 08/22/20 1133  Dressing Changed;Clean;Dry;Intact 08/22/20 1133  Dressing Change Frequency Daily 08/22/20 1133  State of Healing Eschar 08/22/20 1133  Site / Wound Assessment Red;Brown;Yellow 08/22/20 1133  % Wound base Red or Granulating 60% 08/22/20 1133  % Wound base Yellow/Fibrinous Exudate 30% 08/22/20 1133  % Wound base Black/Eschar 10% 08/22/20 1133  % Wound base Other/Granulation Tissue (Comment) 0% 08/22/20 1133  Peri-wound Assessment Intact 08/22/20 1133  Wound Length (cm) 5.5 cm 08/19/20 1056  Wound Width (cm) 6 cm 08/19/20 1056  Wound Depth (cm) 4 cm 08/19/20 1056  Wound Surface Area (cm^2) 33 cm^2 08/19/20 1056  Wound Volume (cm^3) 132 cm^3 08/19/20 1056  Tunneling (cm) 9 08/19/20 1056  Margins Unattached edges (unapproximated) 08/22/20 1133  Drainage Amount Minimal 08/22/20 1133  Drainage Description Serosanguineous 08/22/20 1133  Treatment Debridement (Selective);Hydrotherapy (Pulse lavage);Packing (Saline gauze) 08/22/20 1133   Santyl applied to wound bed prior to applying dressing.     Hydrotherapy Pulsed lavage therapy - wound location: right ischium Pulsed Lavage with Suction (psi): 12 psi Pulsed Lavage with Suction - Normal Saline Used: 1000 mL Pulsed Lavage Tip: Tip with splash  shield Selective Debridement Selective Debridement - Location: right ischium Selective Debridement - Tools Used: Scalpel;Scissors Selective Debridement - Tissue Removed: brown unviable tissue and yellow slough   Wound Assessment and Plan  Wound Therapy - Assess/Plan/Recommendations Wound Therapy - Clinical Statement: Per surgery's note, d/c hydrotherapy. We will sign off. If needs change, please reconsult.  Wound Therapy - Functional Problem List: immobility, sensation impairment Factors Delaying/Impairing Wound Healing: Altered sensation Hydrotherapy Plan: Debridement;Patient/family education;Pulsatile lavage with suction Wound Therapy - Frequency: 6X / week Wound Therapy - Follow Up Recommendations: Home health RN Wound Plan: see above  Wound Therapy Goals- Improve the function of patient's integumentary system by progressing the wound(s) through the phases of wound healing (inflammation - proliferation - remodeling) by: Decrease Necrotic Tissue to: 20 Decrease Necrotic Tissue - Progress: Progressing toward goal Increase Granulation Tissue to: 80 Increase Granulation Tissue - Progress: Progressing toward goal Goals/treatment plan/discharge plan were made with and agreed upon by patient/family: Yes Time For Goal Achievement: 7 days Wound Therapy - Potential for Goals: Good  Goals will be updated until maximal potential achieved or discharge criteria met.  Discharge criteria: when goals achieved, discharge from hospital, MD decision/surgical intervention, no progress towards goals, refusal/missing three consecutive treatments without notification or medical reason.  GP     Thelma Comp 08/22/2020, 12:00 PM   Rolinda Roan, PT, DPT Acute Rehabilitation Services Pager: [phone] Office: [phone]

## 2020-08-22 NOTE — Progress Notes (Addendum)
Pharmacy Antibiotic Note  Brandon Glass is a 39 y.o. 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  Pharmacy has been consulted for vancomycin and meropenem dosing.  WBC 7.9, afebrile; Scr 0.75, CrCl 153.7 ml/min (renal function stable)  Steady-state vancomycin trough level drawn today prior to the 10th dose of vancomycin 1250 mg IV Q 8 hr regimen was 21 mg/L, which is above the goal vancomycin trough level of 15-20 mg/L (last dose of vancomycin prior to trough level was ~1 hr later than scheduled time; vancomycin trough level was drawn ~7 hrs after last dose of Q 8 hr regimen, so true trough would be lower); renal function stable.  Plan: Wait ~1.5 hr and continue vancomycin 1250 mg IV Q 8 hrs (goal vancomycin trough: 15-20 mg/L) Repeat vancomycin level in ~24 hrs to evaluate trend in vancomycin clearance and adjust regimen if needed Continue meropenem 1 gm IV Q 8 hrs Monitor WBC, temp, clinical improvement, renal functions, vancomycin levels  Weight: 95.3 kg (210 lb)  Temp (24hrs), Avg:98.2 F (36.8 C), Min:98 F (36.7 C), Max:98.6 F (37 C)  Recent Labs  Lab 08/16/20 0909 08/16/20 2030 08/17/20 0146 08/18/20 0244 08/19/20 0132 08/19/20 0132 08/19/20 0657 08/21/20 0136 08/22/20 0416 08/22/20 1536  WBC   < >  --  14.9* 14.5* 10.4  --   --  7.6 7.9  --   CREATININE   < >  --  1.03 0.86 0.76  --   --  0.72 0.75  --   LATICACIDVEN  --  1.7  --   --   --   --   --   --   --   --   VANCOTROUGH  --   --   --   --  27*   < > 11*  --   --  21*   < > = values in this interval not displayed.    Estimated Creatinine Clearance: 153.7 mL/min (by C-G formula based on SCr of 0.75 mg/dL).    No Known Allergies  Zosyn X 1 10/19 Clindamycin 10/19 >> 10/22 Meropenem 10/19 >> Vancomycin 10/19 >>  10/22 VT (0130) = 27 mg/L (drawn 1.5 hrs post  infusion) 10/22 VT (0700) = 11 mg/L (drawn 1.5 hrs late) on 1 gm IV Q 8 hrs >> 1250 mg IV Q 8 hrs 10/25 VT (1536) = 21 mg/L (drawn 6.8 hrs after last dose of 1250 mg IV Q 8 hr regimen, so true trough would be lower) >> continue vancomycin 1250 mg IV Q 8 hrs and recheck trough level in ~24 hrs to evaluate vancomycin clearance trend  Microbiology Data This Admission: 10/19 BCx X 2: NG/final 10/19 COVID, flu A, flu B, HIV screen: all negative  Vicki Mallet, PharmD, BCPS, [facility] Clinical Pharmacist 08/22/2020, 2:24 PM

## 2020-08-22 NOTE — Progress Notes (Signed)
6 Days Post-Op Subjective: Patient is resting comfortably in bed with no complaints Dressings were changed just prior to my arrival   Objective: Vital signs in last 24 hours: Temp:  [98 F (36.7 C)-98.6 F (37 C)] 98.1 F (36.7 C) (10/25 2051) Pulse Rate:  [55-63] 63 (10/25 2051) Resp:  [16-20] 16 (10/25 2051) BP: (105-117)/(51-64) 105/58 (10/25 2051) SpO2:  [97 %-100 %] 99 % (10/25 2051)  Intake/Output from previous day: 10/24 0701 - 10/25 0700 In: 720 [P.O.:720] Out: 2400 [Urine:2400]  Intake/Output this shift: No intake/output data recorded.  Physical Exam:  General: Alert and oriented - Very verbally combative  - concerned that he's being judged Gu: The surgical wounds involving the right hemiscrotum and right inguinal crease exhibit excellent granulation tissue with no evidence of skin necrosis or significant purulent debris - surrounding tissue healthy Ext: NT, No erythema  Lab Results: Recent Labs    08/21/20 0136 08/22/20 0416  HGB 10.0* 10.2*  HCT 30.7* 31.1*   BMET Recent Labs    08/21/20 0136 08/22/20 0416  NA 136 137  K 4.8 4.5  CL 103 104  CO2 27 25  GLUCOSE 101* 99  BUN 7 9  CREATININE 0.72 0.75  CALCIUM 8.7* 9.1     Studies/Results: No results found.  Assessment/Plan: 39 year old male with Fournier's gangrene, postop day 6 status post right inguinal and right scrotal debridement  -Continue wet-to-dry dressing changes.   -Patient can be discharged with home health for dressing changes. - f/u with plastic surgery as out patient.  No further surgeries or debridement necessary.  Will sign-off.   LOS: 6 days   08/22/2020, 9:29 PM

## 2020-08-22 NOTE — Progress Notes (Signed)
CRITICAL VALUE STICKER  CRITICAL VALUE: Vancomycin trough 21  RECEIVER (on-site recipient of call):  DATE & TIME NOTIFIED: 08/22/2020 16:15  MESSENGER (representative from lab):  MD NOTIFIED: 08/22/2020   TIME OF NOTIFICATION: 16:20  RESPONSE: Call pharmacy - instructed to not administer this dose of vancomycin
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
## 2020-08-23 DIAGNOSIS — N493 Fournier gangrene: Secondary | ICD-10-CM | POA: Diagnosis not present

## 2020-08-23 LAB — CREATININE, SERUM
Creatinine, Ser: 0.74 mg/dL (ref 0.61–1.24)
GFR, Estimated: >60 mL/min (ref >60)

## 2020-08-23 LAB — VANCOMYCIN, TROUGH: Vancomycin Tr: 19 ug/mL (ref 15–20)

## 2020-08-23 MED ORDER — BISACODYL 5 MG PO TBEC
5.0000 mg | DELAYED_RELEASE_TABLET | Freq: Every day | ORAL | Status: DC | PRN
  Administered 2020-08-23: 5 mg via ORAL
  Filled 2020-08-23: qty 1

## 2020-08-23 NOTE — TOC Progression Note (Addendum)
Transition of Care [facility]) - Progression Note    Patient Details  Name: Brandon Glass MRN: [id number] Date of Birth: 07-Aug-1981  Transition of [facility] CM/SW Contact  Preslynn Bier, Adria Devon, RN Phone Number: 08/23/2020, 12:33 PM  Clinical Narrative:     NCM called all home health agencies last week and was unable to find an accepting home health agency. Will recall today.   Erin with [facility] unable to accept due to staffing.   [place] with [facility] will resend information to her office and let NCM know.   Left message for  Kenard Gower with [facility]. Drew returned call, Chip Boer unable to accept referral.  Eber Armistead with [facility], unable to accept due to staffing. Amy with [facility] unable to accept due to staffing.   Rosey Bath with [facility] unable to assist due to staffing.   Cory with [facility] unable to accept.   [facility] unable to accept due to staffing.  Okey Regal with [facility] unable to accept.  Expected Discharge Plan: Home w Home Health Services Barriers to Discharge: Continued Medical Work up  Expected Discharge Plan and Services Expected Discharge Plan: Home w Home Health Services   Discharge Planning Services: CM Consult Post Acute Care Choice: Home Health Living arrangements for the past 2 months: Hotel/Motel                 DME Arranged: N/A         HH Arranged: RN           Social Determinants of Health (SDOH) Interventions    Readmission Risk Interventions No flowsheet data found.

## 2020-08-23 NOTE — Progress Notes (Signed)
7 Days Post-Op  Subjective: 39 year old male status post debridement of Fournier's gangrene. He reports that he is frustrated with being in the hospital and is concerned about going home with the open wound.  No other complaints  Objective: Vital signs in last 24 hours: Temp:  [98 F (36.7 C)-98.6 F (37 C)] 98 F (36.7 C) (10/26 0415) Pulse Rate:  [50-63] 50 (10/26 0415) Resp:  [16] 16 (10/26 0415) BP: (105-117)/(58-66) 109/66 (10/26 0415) SpO2:  [97 %-100 %] 100 % (10/26 0415)    Intake/Output from previous day: 10/25 0701 - 10/26 0700 In: 4511 [P.O.:240; IV Piggyback:4271] Out: 1900 [Urine:1900] Intake/Output this shift: No intake/output data recorded.  General appearance: alert, cooperative, no distress and Resting in bed, slightly agitated Head: Normocephalic, without obvious abnormality, atraumatic Resp: Unlabored   Lab Results:  CBC Latest Ref Rng & Units 08/22/2020 08/21/2020 08/19/2020  WBC 4.0 - 10.5 K/uL 7.9 7.6 10.4  Hemoglobin 13.0 - 17.0 g/dL 10.2(L) 10.0(L) 9.3(L)  Hematocrit 39 - 52 % 31.1(L) 30.7(L) 27.5(L)  Platelets 150 - 400 K/uL 564(H) 544(H) 432(H)    BMET Recent Labs    08/21/20 0136 08/22/20 0416  NA 136 137  K 4.8 4.5  CL 103 104  CO2 27 25  GLUCOSE 101* 99  BUN 7 9  CREATININE 0.72 0.75  CALCIUM 8.7* 9.1   PT/INR No results for input(s): LABPROT, INR in the last 72 hours. ABG No results for input(s): PHART, HCO3 in the last 72 hours.  Invalid input(s): PCO2, PO2  Studies/Results: No results found.  Anti-infectives: Anti-infectives (From admission, onward)   Start     Dose/Rate Route Frequency Ordered Stop   08/19/20 1600  vancomycin (VANCOREADY) IVPB 1250 mg/250 mL        1,250 mg 166.7 mL/hr over 90 Minutes Intravenous Every 8 hours 08/19/20 0833     08/17/20 0445  vancomycin (VANCOCIN) IVPB 1000 mg/200 mL premix       "Followed by" Linked Group Details   1,000 mg 200 mL/hr over 60 Minutes Intravenous Every 8 hours  08/16/20 1933 08/19/20 0900   08/17/20 0330  clindamycin (CLEOCIN) IVPB 600 mg  Status:  Discontinued        600 mg 100 mL/hr over 30 Minutes Intravenous Every 8 hours 08/16/20 2244 08/19/20 1401   08/17/20 0200  piperacillin-tazobactam (ZOSYN) IVPB 3.375 g  Status:  Discontinued       "Followed by" Linked Group Details   3.375 g 12.5 mL/hr over 240 Minutes Intravenous Every 8 hours 08/16/20 1933 08/16/20 2254   08/16/20 2300  meropenem (MERREM) 1 g in sodium chloride 0.9 % 100 mL IVPB        1 g 200 mL/hr over 30 Minutes Intravenous Every 8 hours 08/16/20 2256     08/16/20 1945  piperacillin-tazobactam (ZOSYN) IVPB 3.375 g       "Followed by" Linked Group Details   3.375 g 100 mL/hr over 30 Minutes Intravenous  Once 08/16/20 1933 08/16/20 2112   08/16/20 1930  vancomycin (VANCOCIN) IVPB 1000 mg/200 mL premix  Status:  Discontinued        1,000 mg 200 mL/hr over 60 Minutes Intravenous  Once 08/16/20 1921 08/16/20 1933   08/16/20 1930  clindamycin (CLEOCIN) IVPB 900 mg        900 mg 100 mL/hr over 30 Minutes Intravenous  Once 08/16/20 1921 08/16/20 2010   08/16/20 1930  vancomycin (VANCOREADY) IVPB 2000 mg/400 mL       "Followed by"  Linked Group Details   2,000 mg 200 mL/hr over 120 Minutes Intravenous  Once 08/16/20 1933 08/16/20 2246      Assessment/Plan: s/p Procedure(s):  Fournier's gangrene:  Discussed the surgical plan with Mr. Statzer.  I discussed with him the importance of allowing the soft tissue swelling to resolve prior to any reconstruction.  I recommended that he follow-up in our clinic next week to see Dr. Arita Miss for evaluation and discussion of surgical intervention.  I discussed with patient that if he is still hospitalized at that time we would reevaluate and discuss surgical options.  Recommend continuing with wet-to-dry dressings per urology and WOCN recommendations. Case management currently working on home health placement for assistance with dressing changes at  home.  Antibiotics per ID  Right ischial decubitus ulcer:  Recommend continuing with recommendations per general surgery, will not continue to follow right ischial ulcer.  Recommend outpatient wound care referral    DVT prophylaxis per primary team.   LOS: 7 days    Leslee Home, PA-C 08/23/2020

## 2020-08-23 NOTE — Progress Notes (Signed)
Pharmacy Antibiotic Note  Brandon Glass is a 39 y.o. 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  Pharmacy has been consulted for vancomycin and meropenem dosing. Primary team is following up with ID re: duration of antibiotics.  WBC 7.9, afebrile; Scr 0.74, CrCl 153.7 ml/min (renal function stable)  Steady-state vancomycin trough level drawn today ~8 hrs after last dose of vancomycin 1250 mg IV Q 8 hr regimen was 19 mg/L, which is within the goal vancomycin trough level range of 15-20 mg/L. Renal function is stable.  Plan: Continue vancomycin 1250 mg IV Q 8 hrs (goal vancomycin trough: 15-20 mg/L) Continue meropenem 1 gm IV Q 8 hrs Monitor WBC, temp, clinical improvement, renal function, vancomycin levels F/U duration of antibiotic therapy  Weight: 95.3 kg (210 lb)  Temp (24hrs), Avg:98.1 F (36.7 C), Min:98 F (36.7 C), Max:98.2 F (36.8 C)  Recent Labs  Lab 08/16/20 2030 08/17/20 0146 08/17/20 0146 08/18/20 0244 08/19/20 0132 08/19/20 0657 08/21/20 0136 08/22/20 0416 08/22/20 1536 08/23/20 1708  WBC  --  14.9*  --  14.5* 10.4  --  7.6 7.9  --   --   CREATININE  --  1.03   < > 0.86 0.76  --  0.72 0.75  --  0.74  LATICACIDVEN 1.7  --   --   --   --   --   --   --   --   --   VANCOTROUGH  --   --   --   --  27*   < >  --   --  21* 19   < > = values in this interval not displayed.    Estimated Creatinine Clearance: 153.7 mL/min (by C-G formula based on SCr of 0.74 mg/dL).    No Known Allergies  Zosyn X 1 10/19 Clindamycin 10/19 >> 10/22 Meropenem 10/19 >> Vancomycin 10/19 >>  10/22 VT (0130) = 27 mg/L (drawn 1.5 hrs post infusion) 10/22 VT (0700) = 11 mg/L (drawn 1.5 hrs late) on 1 gm IV Q 8 hrs >> 1250 mg IV Q 8 hrs 10/25 VT (1536) = 21 mg/L (drawn 6.8 hrs after last dose of 1250 mg IV Q 8 hr regimen, so true trough  would be lower) >> continue vancomycin 1250 mg IV Q 8 hrs and recheck trough level in ~24 hrs to evaluate vancomycin clearance trend 10/26 VT (1708) = 19 mg/L (drawn ~8 hrs after last dose of vancomycin 1250 mg IV Q 8 hr regimen) >> continue vancomycin 1250 mg IV Q 8 hrs, monitor renal function closely  Microbiology Data This Admission: 10/19 BCx X 2: NG/final 10/19 COVID, flu A, flu B, HIV screen: all negative  Vicki Mallet, PharmD, BCPS, [facility] Clinical Pharmacist 08/23/2020, 7:03 PM

## 2020-08-23 NOTE — Plan of Care (Signed)

## 2020-08-23 NOTE — Care Management (Signed)
[facility] has also declined due  to  staffing.   Ronny Flurry RN

## 2020-08-23 NOTE — Progress Notes (Signed)
PT Cancellation Note  Patient Details Name: Brandon Glass MRN: [id number] DOB: 04-27-1981   Cancelled Treatment:    Reason Eval/Treat Not Completed: Other (comment) Pt requesting that PT sign off on pt. Somewhat agitated and stating he does not need PT and he has been doing all of his exercises like he normally does. Will sign off at pt's request. Please re-consult if needs change.   Cindee Salt, DPT  Acute Rehabilitation Services  Pager: [phone] Office: [phone]    Lehman Prom 08/23/2020, 11:41 AM
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
## 2020-08-24 DIAGNOSIS — N493 Fournier gangrene: Secondary | ICD-10-CM | POA: Diagnosis not present

## 2020-08-24 LAB — BASIC METABOLIC PANEL
Anion gap: 9 (ref 5–15)
BUN: 11 mg/dL (ref 6–20)
CO2: 22 mmol/L (ref 22–32)
Calcium: 9.1 mg/dL (ref 8.9–10.3)
Chloride: 105 mmol/L (ref 98–111)
Creatinine, Ser: 0.75 mg/dL (ref 0.61–1.24)
GFR, Estimated: >60 mL/min (ref >60)
Glucose, Bld: 97 mg/dL (ref 70–99)
Potassium: 4.4 mmol/L (ref 3.5–5.1)
Sodium: 136 mmol/L (ref 135–145)

## 2020-08-24 LAB — HEMOGLOBIN AND HEMATOCRIT, BLOOD
HCT: 30.7 % — ABNORMAL LOW (ref 39.0–52.0)
Hemoglobin: 10 g/dL — ABNORMAL LOW (ref 13.0–17.0)

## 2020-08-24 MED ORDER — AMOXICILLIN-POT CLAVULANATE 875-125 MG PO TABS
1.0000 | ORAL_TABLET | Freq: Two times a day (BID) | ORAL | Status: DC
  Administered 2020-08-24 – 2020-08-26 (×5): 1 via ORAL
  Filled 2020-08-24 (×5): qty 1
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
## 2020-08-24 NOTE — TOC Progression Note (Addendum)
Transition of Care [facility]) - Progression Note    Patient Details  Name: Brandon Glass MRN: [id number] Date of Birth: [birth year]  [facility] CM/SW Contact  Markasia Carrol, Adria Devon, RN Phone Number: 08/24/2020, 10:40 AM  Clinical Narrative:    Lowanda Foster with [facility] declined referral.   NCM has called every home health agency for patient's zip code and all have declined due to staffing.   Patient aware and states he can change the dressing himself , and his mother and brother can help. MD and bedside nurse aware.   [facility], [facility], [facility], [facility], [facility], [facility], [facility] , [facility] , [facility], [facility] all unable to accept due to staffing.   Expected Discharge Plan: Home w Home Health Services Barriers to Discharge: Continued Medical Work up  Expected Discharge Plan and Services Expected Discharge Plan: Home w Home Health Services   Discharge Planning Services: CM Consult Post Acute Care Choice: Home Health Living arrangements for the past 2 months: Hotel/Motel                 DME Arranged: N/A         HH Arranged: RN           Social Determinants of Health (SDOH) Interventions    Readmission Risk Interventions No flowsheet data found.
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
## 2020-08-25 DIAGNOSIS — N493 Fournier gangrene: Secondary | ICD-10-CM | POA: Diagnosis not present

## 2020-08-25 NOTE — Care Management (Signed)
[phone] to schedule an appointment left voicemail . Await call back.  Ronny Flurry RN
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
## 2020-08-25 NOTE — Plan of Care (Signed)

## 2020-08-25 NOTE — Progress Notes (Signed)
9 Days Post-Op  Subjective: Patient reports he is overall doing well today, reports dressing changes have been fine.  No complaints.  He reports he is scheduled to go home tomorrow.  He is aware to follow-up in our clinic for further discussion with Dr. Arita Miss about reconstruction of the scrotal/groin wound.  Objective: Vital signs in last 24 hours: Temp:  [98 F (36.7 C)-98.2 F (36.8 C)] 98 F (36.7 C) (10/28 0520) Pulse Rate:  [62-66] 66 (10/28 0520) Resp:  [18] 18 (10/28 0520) BP: (100-110)/(58-69) 110/69 (10/28 0520) SpO2:  [98 %-100 %] 98 % (10/28 0520) Last BM Date: 08/24/20  Intake/Output from previous day: 10/27 0701 - 10/28 0700 In: -  Out: 950 [Urine:950] Intake/Output this shift: Total I/O In: 240 [P.O.:240] Out: -   General appearance: alert, cooperative, no distress and Resting in bed Pulses: 2+ and symmetric Incision/Wound: Right inguinal/scrotal dressings in place, healthy granulation tissue noted.  Swelling has improved.  Condom cath in place.  No foul odor is noted.  No cellulitic changes.  No drainage noted. Extremities: No edema  Lab Results:  CBC Latest Ref Rng & Units 08/24/2020 08/22/2020 08/21/2020  WBC 4.0 - 10.5 K/uL - 7.9 7.6  Hemoglobin 13.0 - 17.0 g/dL 10.0(L) 10.2(L) 10.0(L)  Hematocrit 39 - 52 % 30.7(L) 31.1(L) 30.7(L)  Platelets 150 - 400 K/uL - 564(H) 544(H)    BMET Recent Labs    08/23/20 1708 08/24/20 0326  NA  --  136  K  --  4.4  CL  --  105  CO2  --  22  GLUCOSE  --  97  BUN  --  11  CREATININE 0.74 0.75  CALCIUM  --  9.1   PT/INR No results for input(s): LABPROT, INR in the last 72 hours. ABG No results for input(s): PHART, HCO3 in the last 72 hours.  Invalid input(s): PCO2, PO2  Studies/Results: No results found.  Anti-infectives: Anti-infectives (From admission, onward)   Start     Dose/Rate Route Frequency Ordered Stop   08/24/20 1200  amoxicillin-clavulanate (AUGMENTIN) 875-125 MG per tablet 1 tablet        1  tablet Oral Every 12 hours 08/24/20 0933 08/27/20 2359   08/19/20 1600  vancomycin (VANCOREADY) IVPB 1250 mg/250 mL  Status:  Discontinued        1,250 mg 166.7 mL/hr over 90 Minutes Intravenous Every 8 hours 08/19/20 0833 08/24/20 0933   08/17/20 0445  vancomycin (VANCOCIN) IVPB 1000 mg/200 mL premix       "Followed by" Linked Group Details   1,000 mg 200 mL/hr over 60 Minutes Intravenous Every 8 hours 08/16/20 1933 08/19/20 0900   08/17/20 0330  clindamycin (CLEOCIN) IVPB 600 mg  Status:  Discontinued        600 mg 100 mL/hr over 30 Minutes Intravenous Every 8 hours 08/16/20 2244 08/19/20 1401   08/17/20 0200  piperacillin-tazobactam (ZOSYN) IVPB 3.375 g  Status:  Discontinued       "Followed by" Linked Group Details   3.375 g 12.5 mL/hr over 240 Minutes Intravenous Every 8 hours 08/16/20 1933 08/16/20 2254   08/16/20 2300  meropenem (MERREM) 1 g in sodium chloride 0.9 % 100 mL IVPB  Status:  Discontinued        1 g 200 mL/hr over 30 Minutes Intravenous Every 8 hours 08/16/20 2256 08/24/20 0933   08/16/20 1945  piperacillin-tazobactam (ZOSYN) IVPB 3.375 g       "Followed by" Linked Group Details   3.375  g 100 mL/hr over 30 Minutes Intravenous  Once 08/16/20 1933 08/16/20 2112   08/16/20 1930  vancomycin (VANCOCIN) IVPB 1000 mg/200 mL premix  Status:  Discontinued        1,000 mg 200 mL/hr over 60 Minutes Intravenous  Once 08/16/20 1921 08/16/20 1933   08/16/20 1930  clindamycin (CLEOCIN) IVPB 900 mg        900 mg 100 mL/hr over 30 Minutes Intravenous  Once 08/16/20 1921 08/16/20 2010   08/16/20 1930  vancomycin (VANCOREADY) IVPB 2000 mg/400 mL       "Followed by" Linked Group Details   2,000 mg 200 mL/hr over 120 Minutes Intravenous  Once 08/16/20 1933 08/16/20 2246      Assessment/Plan:  Fournier's Gangrene/Scrotal Wound:  Recommend continued wound care regimen of wet-to-dry dressings per WOCN and urology Recommend patient follow-up in clinic November 3 or 4 to discuss  surgical plan with Dr. Arita Miss. Patient reports his mother is going to help with dressing changes at home.   Right Ischial Ulcer:  Recommend continue with general surgery recommendations. Recommend follow up as an outpatient with wound care clinic for ongoing management -sent EMR message to case management to provide assistance.  Appreciate their help.      LOS: 9 days    Leslee Home, PA-C 08/25/2020

## 2020-08-26 DIAGNOSIS — N493 Fournier gangrene: Secondary | ICD-10-CM | POA: Diagnosis not present

## 2020-08-26 MED ORDER — AMOXICILLIN-POT CLAVULANATE 875-125 MG PO TABS: 1.0000 | ORAL_TABLET | Freq: Two times a day (BID) | ORAL | 0 refills | Status: AC
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
## 2020-08-26 NOTE — Care Management (Addendum)
[facility] [phone] again  to schedule an appointment left voicemail . Await call back.   [phone] Called [facility] back, first available appointment is September 28, 2020 at 0900 am , they will place him on cancellation list and call if they have a cancellation.  Ronny Flurry RN

## 2020-08-30 ENCOUNTER — Inpatient Hospital Stay (INDEPENDENT_AMBULATORY_CARE_PROVIDER_SITE_OTHER): Payer: Medicaid Other | Admitting: Primary Care

## 2020-09-01 ENCOUNTER — Other Ambulatory Visit: Payer: Self-pay

## 2020-09-01 ENCOUNTER — Encounter: Payer: Self-pay | Admitting: Plastic Surgery

## 2020-09-01 ENCOUNTER — Ambulatory Visit (INDEPENDENT_AMBULATORY_CARE_PROVIDER_SITE_OTHER): Payer: Medicaid Other | Admitting: Plastic Surgery

## 2020-09-01 VITALS — BP 123/79 | HR 114 | Temp 98.9°F | Wt 210.0 lb

## 2020-09-01 DIAGNOSIS — S31109A Unspecified open wound of abdominal wall, unspecified quadrant without penetration into peritoneal cavity, initial encounter: Secondary | ICD-10-CM | POA: Diagnosis not present

## 2020-09-01 NOTE — Progress Notes (Signed)
Referring Provider No referring provider defined for this encounter.   CC:  Chief Complaint  Patient presents with  . Advice Only      Brandon Glass is an 39 y.o. male.  HPI: Patient presents for evaluation of his groin wound.  He was seen in the hospital about 2 weeks ago and a debridement of Fournier's gangrene was performed by Dr. Annabell Howells.  He did well in the hospital and was ultimately discharged home with wet-to-dry dressings.  He feels like things are going pretty well but still has a fairly open wound that is tender for him.  He is paraplegic with a wheelchair.  He also has a ischial pressure ulcer on the right side that was evaluated by general surgery in the hospital and no debridement was performed.  Patient is not the best flap candidate with persistent tobacco use and substance abuse.  No Known Allergies  Outpatient Encounter Medications as of 09/01/2020  Medication Sig  . amoxicillin-clavulanate (AUGMENTIN) 875-125 MG tablet Take 1 tablet by mouth 2 (two) times daily.  Marland Kitchen acetaminophen (TYLENOL) 500 MG tablet Take 1-2 tablets (500-1,000 mg total) by mouth every 6 (six) hours as needed for mild pain. (Patient not taking: Reported on 06/22/2020)  . ibuprofen (ADVIL) 200 MG tablet Take 200 mg by mouth every 6 (six) hours as needed for moderate pain. (Patient not taking: Reported on 09/01/2020)   No facility-administered encounter medications on file as of 09/01/2020.     Past Medical History:  Diagnosis Date  . Anxiety   . Depression   . Polysubstance abuse (HCC)   . Substance abuse [name])     Past Surgical History:  Procedure Laterality Date  . arm surgery    . Arm Surgery    . INCISION AND DRAINAGE ABSCESS N/A 08/16/2020   Procedure: DEBRIDEMENT OF GANGRENE OF SCROTAL SKIN AND SUBCUTANEOUS TISSUE AND PLACEMENT OF FOLEY;  Surgeon: Bjorn Pippin, MD;  Location: [facility] OR;  Service: Urology;  Laterality: N/A;  . LEG SURGERY      No family history on file.   Review of  Systems General: Denies fevers, chills, weight loss CV: Denies chest pain, shortness of breath, palpitations  Physical Exam Vitals with BMI 09/01/2020 08/26/2020 08/25/2020  Height - - -  Weight 210 lbs - -  BMI 25.57 - -  Systolic 123 121 762  Diastolic 79 71 67  Pulse 114 68 66  Some encounter information is confidential and restricted. Go to Review Flowsheets activity to see all data.    General:  No acute distress,  Alert and oriented, Non-Toxic, Normal speech and affect Examination shows open wound along the right groin crease involving the right side of the scrotum and extending back towards the perineum.  All the tissue is very healthy and granulating nicely.  There is no purulence or signs of infection.  There is no devitalized tissue.  The right ischial pressure sore is still visible without signs of infection there either.  Assessment/Plan Patient presents with a open wound in the right groin and scrotal area.  I discussed continuing wound care versus taking him back for delayed primary closure.  I think he would be amenable to this and that would get the wound healed faster.  Patient is interested in moving forward and will plan to go ahead with this.  Guarding the ischial ulcer the patient's not a great flap candidate at the moment and I explained the criteria that would be required in order for  it to be considered suitable to debrided and closed with a flap and he is going to think about that.  We discussed the possibility during this upcoming procedure that a small skin graft might be used for the side of the scrotum but this may not be necessary by the time we get to closing it.  We discussed the risk of the procedure that include bleeding, infection, damage surrounding structures and need for additional procedures.  All of his questions were answered and we will plan to move ahead.  [name] 09/01/2020, 2:56 PM

## 2020-09-01 NOTE — H&P (View-Only) (Signed)
 Referring Provider No referring provider defined for this encounter.   CC:  Chief Complaint  Patient presents with  . Advice Only      Brandon Glass is an 39 y.o. male.  HPI: Patient presents for evaluation of his groin wound.  He was seen in the hospital about 2 weeks ago and a debridement of Fournier's gangrene was performed by Dr. Wrenn.  He did well in the hospital and was ultimately discharged home with wet-to-dry dressings.  He feels like things are going pretty well but still has a fairly open wound that is tender for him.  He is paraplegic with a wheelchair.  He also has a ischial pressure ulcer on the right side that was evaluated by general surgery in the hospital and no debridement was performed.  Patient is not the best flap candidate with persistent tobacco use and substance abuse.  No Known Allergies  Outpatient Encounter Medications as of 09/01/2020  Medication Sig  . amoxicillin-clavulanate (AUGMENTIN) 875-125 MG tablet Take 1 tablet by mouth 2 (two) times daily.  . acetaminophen (TYLENOL) 500 MG tablet Take 1-2 tablets (500-1,000 mg total) by mouth every 6 (six) hours as needed for mild pain. (Patient not taking: Reported on 06/22/2020)  . ibuprofen (ADVIL) 200 MG tablet Take 200 mg by mouth every 6 (six) hours as needed for moderate pain. (Patient not taking: Reported on 09/01/2020)   No facility-administered encounter medications on file as of 09/01/2020.     Past Medical History:  Diagnosis Date  . Anxiety   . Depression   . Polysubstance abuse (HCC)   . Substance abuse (HCC)     Past Surgical History:  Procedure Laterality Date  . arm surgery    . Arm Surgery    . INCISION AND DRAINAGE ABSCESS N/A 08/16/2020   Procedure: DEBRIDEMENT OF GANGRENE OF SCROTAL SKIN AND SUBCUTANEOUS TISSUE AND PLACEMENT OF FOLEY;  Surgeon: Wrenn, John, MD;  Location: [facility] OR;  Service: Urology;  Laterality: N/A;  . LEG SURGERY      No family history on file.   Review of  Systems General: Denies fevers, chills, weight loss CV: Denies chest pain, shortness of breath, palpitations  Physical Exam Vitals with BMI 09/01/2020 08/26/2020 08/25/2020  Height - - -  Weight 210 lbs - -  BMI 25.57 - -  Systolic 123 121 117  Diastolic 79 71 67  Pulse 114 68 66  Some encounter information is confidential and restricted. Go to Review Flowsheets activity to see all data.    General:  No acute distress,  Alert and oriented, Non-Toxic, Normal speech and affect Examination shows open wound along the right groin crease involving the right side of the scrotum and extending back towards the perineum.  All the tissue is very healthy and granulating nicely.  There is no purulence or signs of infection.  There is no devitalized tissue.  The right ischial pressure sore is still visible without signs of infection there either.  Assessment/Plan Patient presents with a open wound in the right groin and scrotal area.  I discussed continuing wound care versus taking him back for delayed primary closure.  I think he would be amenable to this and that would get the wound healed faster.  Patient is interested in moving forward and will plan to go ahead with this.  Guarding the ischial ulcer the patient's not a great flap candidate at the moment and I explained the criteria that would be required in order for   it to be considered suitable to debrided and closed with a flap and he is going to think about that.  We discussed the possibility during this upcoming procedure that a small skin graft might be used for the side of the scrotum but this may not be necessary by the time we get to closing it.  We discussed the risk of the procedure that include bleeding, infection, damage surrounding structures and need for additional procedures.  All of his questions were answered and we will plan to move ahead.  [name] 09/01/2020, 2:56 PM

## 2020-09-06 ENCOUNTER — Other Ambulatory Visit (HOSPITAL_COMMUNITY)
Admission: RE | Admit: 2020-09-06 | Discharge: 2020-09-06 | Disposition: A | Discharge status: Home / Self Care | Payer: Medicaid Other | Source: Ambulatory Visit | Attending: Plastic Surgery | Admitting: Plastic Surgery

## 2020-09-06 DIAGNOSIS — Z01812 Encounter for preprocedural laboratory examination: Secondary | ICD-10-CM | POA: Insufficient documentation

## 2020-09-06 DIAGNOSIS — Z20822 Contact with and (suspected) exposure to covid-19: Secondary | ICD-10-CM | POA: Diagnosis not present

## 2020-09-06 LAB — SARS CORONAVIRUS 2 (TAT 6-24 HRS): SARS Coronavirus 2: NEGATIVE

## 2020-09-08 NOTE — Progress Notes (Signed)
I was unable to reach patient by phone.  I left  A message on voice mail.  I instructed the patient to arrive at [facility] Main entrance at 0845,   register in the Admitting Office. DO NOT eat or drink anything after midnight.  I instructed the patient to take the following medications in the am with just enough water to get them down:  Tylenol if needed. I asked patient to shower, not wear any lotions, powders, cologne, jewelry, piercing, make-up or nail polish.  Wear clean clothes. Brush teeth. I informed patient that there will need to be a driver and someone to stay with him/her for the first 24 hours after surgery.   I instructed  patient to call [phone], in the am if there were any questions or problems.

## 2020-09-09 ENCOUNTER — Encounter (HOSPITAL_COMMUNITY): Payer: Self-pay | Admitting: Plastic Surgery

## 2020-09-09 ENCOUNTER — Other Ambulatory Visit: Payer: Self-pay | Admitting: Plastic Surgery

## 2020-09-09 ENCOUNTER — Ambulatory Visit (HOSPITAL_COMMUNITY): Payer: Medicaid Other | Admitting: Anesthesiology

## 2020-09-09 ENCOUNTER — Encounter (HOSPITAL_COMMUNITY)
Admission: RE | Disposition: A | Discharge status: Home / Self Care | Payer: Self-pay | Source: Home / Self Care | Attending: Plastic Surgery

## 2020-09-09 ENCOUNTER — Other Ambulatory Visit: Payer: Self-pay

## 2020-09-09 ENCOUNTER — Ambulatory Visit (HOSPITAL_COMMUNITY)
Admission: RE | Admit: 2020-09-09 | Discharge: 2020-09-09 | Disposition: A | Discharge status: Home / Self Care | Payer: Medicaid Other | Attending: Plastic Surgery | Admitting: Plastic Surgery

## 2020-09-09 DIAGNOSIS — S31103A Unspecified open wound of abdominal wall, right lower quadrant without penetration into peritoneal cavity, initial encounter: Secondary | ICD-10-CM | POA: Insufficient documentation

## 2020-09-09 DIAGNOSIS — G8220 Paraplegia, unspecified: Secondary | ICD-10-CM | POA: Insufficient documentation

## 2020-09-09 DIAGNOSIS — N493 Fournier gangrene: Secondary | ICD-10-CM | POA: Insufficient documentation

## 2020-09-09 DIAGNOSIS — Z79899 Other long term (current) drug therapy: Secondary | ICD-10-CM | POA: Insufficient documentation

## 2020-09-09 DIAGNOSIS — X58XXXA Exposure to other specified factors, initial encounter: Secondary | ICD-10-CM | POA: Insufficient documentation

## 2020-09-09 DIAGNOSIS — Y9389 Activity, other specified: Secondary | ICD-10-CM | POA: Diagnosis not present

## 2020-09-09 DIAGNOSIS — S31109A Unspecified open wound of abdominal wall, unspecified quadrant without penetration into peritoneal cavity, initial encounter: Secondary | ICD-10-CM

## 2020-09-09 DIAGNOSIS — F17200 Nicotine dependence, unspecified, uncomplicated: Secondary | ICD-10-CM | POA: Diagnosis not present

## 2020-09-09 DIAGNOSIS — N5089 Other specified disorders of the male genital organs: Secondary | ICD-10-CM | POA: Diagnosis not present

## 2020-09-09 HISTORY — PX: SKIN SPLIT GRAFT: SHX444

## 2020-09-09 HISTORY — PX: DEBRIDEMENT AND CLOSURE WOUND: SHX5614

## 2020-09-09 HISTORY — DX: Paraplegia, unspecified: G82.20

## 2020-09-09 HISTORY — DX: Accidental discharge from unspecified firearms or gun, initial encounter: W34.00XA

## 2020-09-09 LAB — CBC
HCT: 37.5 % — ABNORMAL LOW (ref 39.0–52.0)
Hemoglobin: 12 g/dL — ABNORMAL LOW (ref 13.0–17.0)
MCH: 29.3 pg (ref 26.0–34.0)
MCHC: 32 g/dL (ref 30.0–36.0)
MCV: 91.5 fL (ref 80.0–100.0)
Platelets: 482 10*3/uL — ABNORMAL HIGH (ref 150–400)
RBC: 4.1 MIL/uL — ABNORMAL LOW (ref 4.22–5.81)
RDW: 13.4 % (ref 11.5–15.5)
WBC: 8.4 10*3/uL (ref 4.0–10.5)
nRBC: 0 % (ref 0.0–0.2)

## 2020-09-09 LAB — COMPREHENSIVE METABOLIC PANEL
ALT: 16 U/L (ref 0–44)
AST: 17 U/L (ref 15–41)
Albumin: 3.5 g/dL (ref 3.5–5.0)
Alkaline Phosphatase: 66 U/L (ref 38–126)
Anion gap: 12 (ref 5–15)
BUN: 8 mg/dL (ref 6–20)
CO2: 22 mmol/L (ref 22–32)
Calcium: 9.5 mg/dL (ref 8.9–10.3)
Chloride: 106 mmol/L (ref 98–111)
Creatinine, Ser: 0.86 mg/dL (ref 0.61–1.24)
GFR, Estimated: >60 mL/min (ref >60)
Glucose, Bld: 85 mg/dL (ref 70–99)
Potassium: 3.5 mmol/L (ref 3.5–5.1)
Sodium: 140 mmol/L (ref 135–145)
Total Bilirubin: 0.6 mg/dL (ref 0.3–1.2)
Total Protein: 7.8 g/dL (ref 6.5–8.1)

## 2020-09-09 SURGERY — DEBRIDEMENT, WOUND, WITH CLOSURE
Anesthesia: General | Site: Groin | Laterality: Right

## 2020-09-09 MED ORDER — PROPOFOL 10 MG/ML IV BOLUS
INTRAVENOUS | Status: DC | PRN
  Administered 2020-09-09: 200 mg via INTRAVENOUS

## 2020-09-09 MED ORDER — CHLORHEXIDINE GLUCONATE 0.12 % MT SOLN
15.0000 mL | OROMUCOSAL | Status: AC
  Filled 2020-09-09: qty 15

## 2020-09-09 MED ORDER — ACETAMINOPHEN 500 MG PO TABS: 500.0000 mg | ORAL_TABLET | Freq: Four times a day (QID) | ORAL | 0 refills | Status: DC | PRN

## 2020-09-09 MED ORDER — CEFAZOLIN SODIUM-DEXTROSE 2-4 GM/100ML-% IV SOLN
2.0000 g | INTRAVENOUS | Status: AC
  Administered 2020-09-09: 2 g via INTRAVENOUS

## 2020-09-09 MED ORDER — ACETAMINOPHEN 500 MG PO TABS
1000.0000 mg | ORAL_TABLET | Freq: Once | ORAL | Status: AC
  Administered 2020-09-09: 1000 mg via ORAL
  Filled 2020-09-09: qty 2

## 2020-09-09 MED ORDER — CEFAZOLIN SODIUM-DEXTROSE 2-4 GM/100ML-% IV SOLN
INTRAVENOUS | Status: AC
  Filled 2020-09-09: qty 100

## 2020-09-09 MED ORDER — PHENYLEPHRINE 40 MCG/ML (10ML) SYRINGE FOR IV PUSH (FOR BLOOD PRESSURE SUPPORT)
PREFILLED_SYRINGE | INTRAVENOUS | Status: AC
  Filled 2020-09-09: qty 20

## 2020-09-09 MED ORDER — PHENYLEPHRINE 40 MCG/ML (10ML) SYRINGE FOR IV PUSH (FOR BLOOD PRESSURE SUPPORT)
PREFILLED_SYRINGE | INTRAVENOUS | Status: AC
  Filled 2020-09-09: qty 10

## 2020-09-09 MED ORDER — IBUPROFEN 600 MG PO TABS: 600.0000 mg | ORAL_TABLET | Freq: Four times a day (QID) | ORAL | 0 refills | Status: DC | PRN

## 2020-09-09 MED ORDER — SODIUM CHLORIDE 0.9 % IR SOLN
Status: DC | PRN
  Administered 2020-09-09: 3000 mL

## 2020-09-09 MED ORDER — CEFAZOLIN SODIUM 1 G IJ SOLR
INTRAMUSCULAR | Status: AC
  Filled 2020-09-09: qty 10

## 2020-09-09 MED ORDER — ONDANSETRON HCL 4 MG/2ML IJ SOLN
INTRAMUSCULAR | Status: AC
  Filled 2020-09-09: qty 2

## 2020-09-09 MED ORDER — PROPOFOL 10 MG/ML IV BOLUS
INTRAVENOUS | Status: AC
  Filled 2020-09-09: qty 20

## 2020-09-09 MED ORDER — SUCCINYLCHOLINE CHLORIDE 200 MG/10ML IV SOSY
PREFILLED_SYRINGE | INTRAVENOUS | Status: AC
  Filled 2020-09-09: qty 10

## 2020-09-09 MED ORDER — LIDOCAINE 2% (20 MG/ML) 5 ML SYRINGE
INTRAMUSCULAR | Status: DC | PRN
  Administered 2020-09-09: 80 mg via INTRAVENOUS

## 2020-09-09 MED ORDER — MIDAZOLAM HCL 2 MG/2ML IJ SOLN
INTRAMUSCULAR | Status: AC
  Filled 2020-09-09: qty 2

## 2020-09-09 MED ORDER — LIDOCAINE-EPINEPHRINE 1 %-1:100000 IJ SOLN
INTRAMUSCULAR | Status: AC
  Filled 2020-09-09: qty 1

## 2020-09-09 MED ORDER — ESMOLOL HCL 100 MG/10ML IV SOLN
INTRAVENOUS | Status: AC
  Filled 2020-09-09: qty 10

## 2020-09-09 MED ORDER — CHLORHEXIDINE GLUCONATE 0.12 % MT SOLN
OROMUCOSAL | Status: AC
  Administered 2020-09-09: 15 mL via OROMUCOSAL
  Filled 2020-09-09: qty 15

## 2020-09-09 MED ORDER — PHENYLEPHRINE 40 MCG/ML (10ML) SYRINGE FOR IV PUSH (FOR BLOOD PRESSURE SUPPORT)
PREFILLED_SYRINGE | INTRAVENOUS | Status: DC | PRN
  Administered 2020-09-09 (×2): 80 ug via INTRAVENOUS

## 2020-09-09 MED ORDER — LACTATED RINGERS IV SOLN: INTRAVENOUS | Status: DC

## 2020-09-09 MED ORDER — 0.9 % SODIUM CHLORIDE (POUR BTL) OPTIME
TOPICAL | Status: DC | PRN
  Administered 2020-09-09: 1000 mL

## 2020-09-09 MED ORDER — HYDROCODONE-ACETAMINOPHEN 5-325 MG PO TABS: 1.0000 | ORAL_TABLET | Freq: Three times a day (TID) | ORAL | 0 refills | Status: AC | PRN

## 2020-09-09 MED ORDER — LIDOCAINE-EPINEPHRINE 1 %-1:100000 IJ SOLN
INTRAMUSCULAR | Status: DC | PRN
  Administered 2020-09-09: 20 mL

## 2020-09-09 MED ORDER — ONDANSETRON HCL 4 MG/2ML IJ SOLN
INTRAMUSCULAR | Status: DC | PRN
  Administered 2020-09-09: 4 mg via INTRAVENOUS

## 2020-09-09 MED ORDER — MIDAZOLAM HCL 5 MG/5ML IJ SOLN
INTRAMUSCULAR | Status: DC | PRN
  Administered 2020-09-09 (×2): 1 mg via INTRAVENOUS

## 2020-09-09 MED ORDER — FENTANYL CITRATE (PF) 100 MCG/2ML IJ SOLN: 25.0000 ug | INTRAMUSCULAR | Status: DC | PRN

## 2020-09-09 MED ORDER — LIDOCAINE 2% (20 MG/ML) 5 ML SYRINGE
INTRAMUSCULAR | Status: AC
  Filled 2020-09-09: qty 5

## 2020-09-09 MED ORDER — FENTANYL CITRATE (PF) 250 MCG/5ML IJ SOLN
INTRAMUSCULAR | Status: AC
  Filled 2020-09-09: qty 5

## 2020-09-09 SURGICAL SUPPLY — 54 items
BAG DECANTER FOR FLEXI CONT (MISCELLANEOUS) ×6 IMPLANT
BLADE CLIPPER SURG (BLADE) IMPLANT
BLADE DERMATOME SS (BLADE) ×1 IMPLANT
BNDG ELASTIC 4X5.8 VLCR STR LF (GAUZE/BANDAGES/DRESSINGS) IMPLANT
BNDG ELASTIC 6X5.8 VLCR STR LF (GAUZE/BANDAGES/DRESSINGS) IMPLANT
BNDG GAUZE ELAST 4 BULKY (GAUZE/BANDAGES/DRESSINGS) ×2 IMPLANT
BRUSH SCRUB EZ PLAIN DRY (MISCELLANEOUS) ×6 IMPLANT
CANISTER SUCT 3000ML PPV (MISCELLANEOUS) IMPLANT
CANISTER WOUND CARE 500ML ATS (WOUND CARE) IMPLANT
COVER SURGICAL LIGHT HANDLE (MISCELLANEOUS) ×3 IMPLANT
DERMACARRIERS GRAFT 1 TO 1.5 (DISPOSABLE)
DRAPE EXTREMITY T 121X128X90 (DISPOSABLE) IMPLANT
DRAPE HALF SHEET 40X57 (DRAPES) ×3 IMPLANT
DRAPE INCISE IOBAN 66X45 STRL (DRAPES) ×3 IMPLANT
DRAPE ORTHO SPLIT 77X108 STRL (DRAPES) ×6
DRAPE SURG ORHT 6 SPLT 77X108 (DRAPES) ×2 IMPLANT
DRSG CALCIUM ALGINATE 4X4 (GAUZE/BANDAGES/DRESSINGS) IMPLANT
DRSG MEPITEL 4X7.2 (GAUZE/BANDAGES/DRESSINGS) ×2 IMPLANT
DRSG OPSITE 6X11 MED (GAUZE/BANDAGES/DRESSINGS) IMPLANT
DRSG PAD ABDOMINAL 8X10 ST (GAUZE/BANDAGES/DRESSINGS) ×4 IMPLANT
DRSG VAC ATS LRG SENSATRAC (GAUZE/BANDAGES/DRESSINGS) IMPLANT
DRSG VAC ATS MED SENSATRAC (GAUZE/BANDAGES/DRESSINGS) IMPLANT
DRSG VAC ATS SM SENSATRAC (GAUZE/BANDAGES/DRESSINGS) IMPLANT
ELECT REM PT RETURN 9FT ADLT (ELECTROSURGICAL)
ELECTRODE REM PT RTRN 9FT ADLT (ELECTROSURGICAL) IMPLANT
FILTER STRAW FLUID ASPIR (MISCELLANEOUS) ×3 IMPLANT
GAUZE SPONGE 4X4 12PLY STRL (GAUZE/BANDAGES/DRESSINGS) ×6 IMPLANT
GAUZE XEROFORM 5X9 LF (GAUZE/BANDAGES/DRESSINGS) IMPLANT
GLOVE BIOGEL M STRL SZ7.5 (GLOVE) ×6 IMPLANT
GLOVE INDICATOR 8.0 STRL GRN (GLOVE) ×6 IMPLANT
GOWN STRL REUS W/ TWL LRG LVL3 (GOWN DISPOSABLE) ×2 IMPLANT
GOWN STRL REUS W/TWL LRG LVL3 (GOWN DISPOSABLE) ×6
GRAFT DERMACARRIERS 1 TO 1.5 (DISPOSABLE) ×1 IMPLANT
HANDPIECE INTERPULSE COAX TIP (DISPOSABLE) ×3
IV NS 1000ML (IV SOLUTION) ×3
IV NS 1000ML BAXH (IV SOLUTION) ×1 IMPLANT
KIT BASIN OR (CUSTOM PROCEDURE TRAY) ×3 IMPLANT
KIT TURNOVER KIT B (KITS) ×3 IMPLANT
MANIFOLD NEPTUNE II (INSTRUMENTS) ×3 IMPLANT
NDL SPNL 18GX3.5 QUINCKE PK (NEEDLE) ×2 IMPLANT
NEEDLE SPNL 18GX3.5 QUINCKE PK (NEEDLE) ×6 IMPLANT
NS IRRIG 1000ML POUR BTL (IV SOLUTION) ×3 IMPLANT
PACK GENERAL/GYN (CUSTOM PROCEDURE TRAY) ×3 IMPLANT
PAD ABD 8X10 STRL (GAUZE/BANDAGES/DRESSINGS) ×4 IMPLANT
PAD ARMBOARD 7.5X6 YLW CONV (MISCELLANEOUS) ×6 IMPLANT
SET HNDPC FAN SPRY TIP SCT (DISPOSABLE) IMPLANT
STAPLER VISISTAT 35W (STAPLE) IMPLANT
SUT CHROMIC 4 0 PS 2 18 (SUTURE) IMPLANT
SUT PDS AB 3-0 SH 27 (SUTURE) ×4 IMPLANT
SYR 50ML LL SCALE MARK (SYRINGE) ×6 IMPLANT
SYR CONTROL 10ML LL (SYRINGE) ×3 IMPLANT
TOWEL GREEN STERILE (TOWEL DISPOSABLE) ×3 IMPLANT
TOWEL GREEN STERILE FF (TOWEL DISPOSABLE) ×3 IMPLANT
UNDERPAD 30X36 HEAVY ABSORB (UNDERPADS AND DIAPERS) ×3 IMPLANT

## 2020-09-09 NOTE — Anesthesia Postprocedure Evaluation (Signed)
Anesthesia Post Note  Patient: Brandon Glass  Procedure(s) Performed: Debridement and complex closure of right groin wound (Right Groin) SKIN GRAFT SPLIT THICKNESS (Right Groin)     Patient location during evaluation: PACU Anesthesia Type: General Level of consciousness: awake and alert Pain management: pain level controlled Vital Signs Assessment: post-procedure vital signs reviewed and stable Respiratory status: spontaneous breathing, nonlabored ventilation, respiratory function stable and patient connected to nasal cannula oxygen Cardiovascular status: blood pressure returned to baseline and stable Postop Assessment: no apparent nausea or vomiting Anesthetic complications: no   No complications documented.  Last Vitals:  Vitals:   09/09/20 1345 09/09/20 1401  BP: (!) 161/93 135/78  Pulse: (!) 117 (!) 103  Resp: 16 (!) 23  Temp: (!) 36.2 C   SpO2: 100% 100%    Last Pain:  Vitals:   09/09/20 1345  TempSrc:   PainSc: 0-No pain                 Zorah Backes L Aveion Nguyen

## 2020-09-09 NOTE — Interval H&P Note (Signed)
History and Physical Interval Note:  09/09/2020 9:03 AM  Brandon Glass  has presented today for surgery, with the diagnosis of scrotal wound.  The various methods of treatment have been discussed with the patient and family. After consideration of risks, benefits and other options for treatment, the patient has consented to  Procedure(s) with comments: Debridement and complex closure of right groin wound (Right) - 1 hour, please SKIN GRAFT SPLIT THICKNESS (Right) as a surgical intervention.  The patient's history has been reviewed, patient examined, no change in status, stable for surgery.  I have reviewed the patient's chart and labs.  Questions were answered to the patient's satisfaction.     [name]

## 2020-09-09 NOTE — Brief Op Note (Signed)
09/09/2020  1:25 PM  PATIENT:  Brandon Glass  39 y.o. male  PRE-OPERATIVE DIAGNOSIS:  scrotal wound  POST-OPERATIVE DIAGNOSIS:  scrotal wound  PROCEDURE:  Procedure(s) with comments: Debridement and complex closure of right groin wound (Right) - 1 hour, please SKIN GRAFT SPLIT THICKNESS (Right)  SURGEON:  Surgeon(s) and Role:    * Norval Slaven, Wendy Poet, MD - Primary  PHYSICIAN ASSISTANT: Joni Fears, PA  ASSISTANTS: none   ANESTHESIA:   general  EBL:  10   BLOOD ADMINISTERED:none  DRAINS: none   LOCAL MEDICATIONS USED:  XYLOCAINE   SPECIMEN:  No Specimen  DISPOSITION OF SPECIMEN:  NA  COUNTS:  YES  TOURNIQUET:  * No tourniquets in log *  DICTATION: .Dragon Dictation  PLAN OF CARE: Discharge to home after PACU  PATIENT DISPOSITION:  PACU - hemodynamically stable.   Delay start of Pharmacological VTE agent (>24hrs) due to surgical blood loss or risk of bleeding: not applicable
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
## 2020-09-09 NOTE — Op Note (Signed)
Operative Note   DATE OF OPERATION: 09/09/2020  SURGICAL DEPARTMENT: Plastic Surgery  PREOPERATIVE DIAGNOSES: Right groin and scrotal wound  POSTOPERATIVE DIAGNOSES:  same  PROCEDURE: 1. Debridement of right groin and scrotal wound totaling 20 x 5 cm.  This was excisional including skin and subcutaneous tissue. 2.  Complex closure right groin wound totaling 25 cm in length  SURGEON: Ancil Linsey, MD  ASSISTANT: Joni Fears, PA  ANESTHESIA:  General.   COMPLICATIONS: None.   INDICATIONS FOR PROCEDURE:  The patient, Brandon Glass is a 39 y.o. 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 Risks, benefits and alternatives were fully discussed. Specific risks including but not limited to bleeding, infection, hematoma, seroma, scarring, pain, contracture, asymmetry, wound healing problems, and need for further surgery were all discussed. The patient did have an ample opportunity to have questions answered to satisfaction.   DESCRIPTION OF PROCEDURE:  The patient was taken to the operating room. SCDs were placed and Ancef antibiotics were given.  General anesthesia was administered.  The patient's operative site was prepped and draped in a sterile fashion. A time out was performed and all information was confirmed to be correct.  Started by infiltrating local anesthesia around the wound.  This had mostly healthy tissue at the base.  The wound was about 20 cm in length and then went on to the scrotum for about 5 cm from the groin crease.  I used a 15 blade to debride the skin edges to ensure an acute healthy skin apposition.  I then undermined the skin circumferentially with cautery for about 2 cm in all directions.  The wound was then irrigated copiously with pulse lavage.  Skin edges were then advanced and closed in layers with  interrupted buried 3-0 PDS sutures and a combination of running and interrupted 3-0 Vicryl sutures.  This gave a nice on table result and closure.  Soft dressing was then applied.  The patient tolerated the procedure well.  There were no complications. The patient was allowed to wake from anesthesia, extubated and taken to the recovery room in satisfactory condition.

## 2020-09-09 NOTE — Transfer of Care (Signed)
Immediate Anesthesia Transfer of Care Note  Patient: Brandon Glass  Procedure(s) Performed: Debridement and complex closure of right groin wound (Right Groin) SKIN GRAFT SPLIT THICKNESS (Right Groin)  Patient Location: PACU  Anesthesia Type:General  Level of Consciousness: awake, alert  and oriented  Airway & Oxygen Therapy: Patient Spontanous Breathing  Post-op Assessment: Report given to RN and Post -op Vital signs reviewed and stable  Post vital signs: Reviewed and stable  Last Vitals:  Vitals Value Taken Time  BP    Temp    Pulse    Resp    SpO2      Last Pain:  Vitals:   09/09/20 0902  TempSrc:   PainSc: 5       Patients Stated Pain Goal: 0 (09/09/20 0902)  Complications: No complications documented.

## 2020-09-09 NOTE — Discharge Instructions (Signed)
Activity: As tolerated, but avoid strenuous activity until follow up visit.  Diet: Regular  Wound Care: Keep dressing clean & dry for 2 days.  After that you can shower normally.  Redress the wound as needed for comfort.  Special Instructions: Scrotal incision: daily dressing changes of gauze and ABD pad, secure with underwear Wound on bottom: continue daily wet to try dressing changes.   Call our office if any unusual problems occur such as pain, excessive bleeding, unrelieved nausea/vomiting, fever &/or chills.  Follow-up appointment: Scheduled for next week. 11/18

## 2020-09-09 NOTE — Anesthesia Procedure Notes (Signed)
Procedure Name: LMA Insertion Date/Time: 09/09/2020 12:34 PM Performed by: Audie Pinto, CRNA Pre-anesthesia Checklist: Patient identified, Emergency Drugs available, Suction available and Patient being monitored Patient Re-evaluated:Patient Re-evaluated prior to induction Oxygen Delivery Method: Circle system utilized Preoxygenation: Pre-oxygenation with 100% oxygen Induction Type: IV induction LMA: LMA inserted LMA Size: 5.0 Placement Confirmation: positive ETCO2 Dental Injury: Teeth and Oropharynx as per pre-operative assessment

## 2020-09-10 ENCOUNTER — Encounter (HOSPITAL_COMMUNITY): Payer: Self-pay | Admitting: Plastic Surgery

## 2020-09-15 ENCOUNTER — Ambulatory Visit (INDEPENDENT_AMBULATORY_CARE_PROVIDER_SITE_OTHER): Payer: Medicaid Other | Admitting: Plastic Surgery

## 2020-09-15 ENCOUNTER — Other Ambulatory Visit: Payer: Self-pay

## 2020-09-15 ENCOUNTER — Encounter: Payer: Self-pay | Admitting: Plastic Surgery

## 2020-09-15 VITALS — BP 121/71 | HR 101 | Temp 98.7°F

## 2020-09-15 DIAGNOSIS — S31109A Unspecified open wound of abdominal wall, unspecified quadrant without penetration into peritoneal cavity, initial encounter: Secondary | ICD-10-CM

## 2020-09-15 NOTE — Progress Notes (Signed)
Patient presents postop from debridement and complex closure of right groin and scrotal wound.  He feels like there may have been some separation superiorly but the posterior aspect looks to be holding.  On examination there has been some separation of the closure along the superior/anterior aspect of the groin incision and onto the scrotum.  Everything beneath it looks healthy and there is no signs of purulence or infection.  The more posterior aspect looks to be holding fine.  He thinks it may have separated while he was getting in and out of his wheelchair.  We will plan to continue wet-to-dry dressings on this and I suspect it will close up in the next few weeks.  All of his questions were answered.  We will see him in 3 weeks time.

## 2020-09-28 ENCOUNTER — Encounter (HOSPITAL_BASED_OUTPATIENT_CLINIC_OR_DEPARTMENT_OTHER): Payer: Medicaid Other | Admitting: Physician Assistant

## 2020-09-28 DIAGNOSIS — L8990 Pressure ulcer of unspecified site, unspecified stage: Secondary | ICD-10-CM

## 2020-09-28 HISTORY — DX: Pressure ulcer of unspecified site, unspecified stage: L89.90

## 2020-10-04 ENCOUNTER — Other Ambulatory Visit: Payer: Self-pay

## 2020-10-04 ENCOUNTER — Encounter (HOSPITAL_COMMUNITY): Payer: Self-pay | Admitting: Emergency Medicine

## 2020-10-04 ENCOUNTER — Inpatient Hospital Stay (HOSPITAL_COMMUNITY)
Admission: EM | Admit: 2020-10-04 | Discharge: 2020-10-12 | DRG: 871 | Disposition: A | Discharge status: Home / Self Care | Payer: Medicaid Other | Attending: Internal Medicine | Admitting: Internal Medicine

## 2020-10-04 ENCOUNTER — Emergency Department (HOSPITAL_COMMUNITY): Payer: Medicaid Other

## 2020-10-04 DIAGNOSIS — R6520 Severe sepsis without septic shock: Secondary | ICD-10-CM | POA: Diagnosis present

## 2020-10-04 DIAGNOSIS — A414 Sepsis due to anaerobes: Secondary | ICD-10-CM | POA: Diagnosis present

## 2020-10-04 DIAGNOSIS — Z681 Body mass index (BMI) 19 or less, adult: Secondary | ICD-10-CM | POA: Diagnosis not present

## 2020-10-04 DIAGNOSIS — G8220 Paraplegia, unspecified: Secondary | ICD-10-CM | POA: Diagnosis present

## 2020-10-04 DIAGNOSIS — N493 Fournier gangrene: Secondary | ICD-10-CM | POA: Diagnosis present

## 2020-10-04 DIAGNOSIS — F17210 Nicotine dependence, cigarettes, uncomplicated: Secondary | ICD-10-CM | POA: Diagnosis present

## 2020-10-04 DIAGNOSIS — E871 Hypo-osmolality and hyponatremia: Secondary | ICD-10-CM | POA: Diagnosis present

## 2020-10-04 DIAGNOSIS — E876 Hypokalemia: Secondary | ICD-10-CM | POA: Diagnosis not present

## 2020-10-04 DIAGNOSIS — Z993 Dependence on wheelchair: Secondary | ICD-10-CM

## 2020-10-04 DIAGNOSIS — E46 Unspecified protein-calorie malnutrition: Secondary | ICD-10-CM | POA: Diagnosis present

## 2020-10-04 DIAGNOSIS — A4159 Other Gram-negative sepsis: Secondary | ICD-10-CM | POA: Diagnosis present

## 2020-10-04 DIAGNOSIS — F1210 Cannabis abuse, uncomplicated: Secondary | ICD-10-CM | POA: Diagnosis present

## 2020-10-04 DIAGNOSIS — L89314 Pressure ulcer of right buttock, stage 4: Secondary | ICD-10-CM | POA: Diagnosis present

## 2020-10-04 DIAGNOSIS — S34101S Unspecified injury to L1 level of lumbar spinal cord, sequela: Secondary | ICD-10-CM

## 2020-10-04 DIAGNOSIS — D62 Acute posthemorrhagic anemia: Secondary | ICD-10-CM | POA: Diagnosis not present

## 2020-10-04 DIAGNOSIS — S32019S Unspecified fracture of first lumbar vertebra, sequela: Secondary | ICD-10-CM | POA: Diagnosis not present

## 2020-10-04 DIAGNOSIS — K611 Rectal abscess: Secondary | ICD-10-CM | POA: Diagnosis present

## 2020-10-04 DIAGNOSIS — A4151 Sepsis due to Escherichia coli [E. coli]: Secondary | ICD-10-CM | POA: Diagnosis present

## 2020-10-04 DIAGNOSIS — Z20822 Contact with and (suspected) exposure to covid-19: Secondary | ICD-10-CM | POA: Diagnosis present

## 2020-10-04 DIAGNOSIS — L089 Local infection of the skin and subcutaneous tissue, unspecified: Secondary | ICD-10-CM

## 2020-10-04 DIAGNOSIS — Z560 Unemployment, unspecified: Secondary | ICD-10-CM

## 2020-10-04 DIAGNOSIS — K651 Peritoneal abscess: Secondary | ICD-10-CM

## 2020-10-04 DIAGNOSIS — N179 Acute kidney failure, unspecified: Secondary | ICD-10-CM | POA: Diagnosis not present

## 2020-10-04 DIAGNOSIS — T368X5A Adverse effect of other systemic antibiotics, initial encounter: Secondary | ICD-10-CM | POA: Diagnosis not present

## 2020-10-04 DIAGNOSIS — L8994 Pressure ulcer of unspecified site, stage 4: Secondary | ICD-10-CM | POA: Diagnosis present

## 2020-10-04 DIAGNOSIS — T508X5A Adverse effect of diagnostic agents, initial encounter: Secondary | ICD-10-CM | POA: Diagnosis not present

## 2020-10-04 DIAGNOSIS — S32029S Unspecified fracture of second lumbar vertebra, sequela: Secondary | ICD-10-CM

## 2020-10-04 DIAGNOSIS — N390 Urinary tract infection, site not specified: Secondary | ICD-10-CM | POA: Diagnosis present

## 2020-10-04 DIAGNOSIS — A419 Sepsis, unspecified organism: Secondary | ICD-10-CM | POA: Diagnosis present

## 2020-10-04 DIAGNOSIS — F1910 Other psychoactive substance abuse, uncomplicated: Secondary | ICD-10-CM | POA: Diagnosis not present

## 2020-10-04 LAB — COMPREHENSIVE METABOLIC PANEL
ALT: 27 U/L (ref 0–44)
AST: 27 U/L (ref 15–41)
Albumin: 2.1 g/dL — ABNORMAL LOW (ref 3.5–5.0)
Alkaline Phosphatase: 88 U/L (ref 38–126)
Anion gap: 11 (ref 5–15)
BUN: 9 mg/dL (ref 6–20)
CO2: 23 mmol/L (ref 22–32)
Calcium: 8.1 mg/dL — ABNORMAL LOW (ref 8.9–10.3)
Chloride: 96 mmol/L — ABNORMAL LOW (ref 98–111)
Creatinine, Ser: 0.9 mg/dL (ref 0.61–1.24)
GFR, Estimated: >60 mL/min (ref >60)
Glucose, Bld: 140 mg/dL — ABNORMAL HIGH (ref 70–99)
Potassium: 2.9 mmol/L — ABNORMAL LOW (ref 3.5–5.1)
Sodium: 130 mmol/L — ABNORMAL LOW (ref 135–145)
Total Bilirubin: 1 mg/dL (ref 0.3–1.2)
Total Protein: 6.9 g/dL (ref 6.5–8.1)

## 2020-10-04 LAB — PROTIME-INR
INR: 1.3 — ABNORMAL HIGH (ref 0.8–1.2)
Prothrombin Time: 15.5 seconds — ABNORMAL HIGH (ref 11.4–15.2)

## 2020-10-04 LAB — RAPID URINE DRUG SCREEN, HOSP PERFORMED
Amphetamines: NOT DETECTED
Barbiturates: NOT DETECTED
Benzodiazepines: NOT DETECTED
Cocaine: NOT DETECTED
Opiates: NOT DETECTED
Tetrahydrocannabinol: POSITIVE — AB

## 2020-10-04 LAB — CBC WITH DIFFERENTIAL/PLATELET
Abs Immature Granulocytes: 0.27 10*3/uL — ABNORMAL HIGH (ref 0.00–0.07)
Basophils Absolute: 0 10*3/uL (ref 0.0–0.1)
Basophils Relative: 0 %
Eosinophils Absolute: 0 10*3/uL (ref 0.0–0.5)
Eosinophils Relative: 0 %
HCT: 25.7 % — ABNORMAL LOW (ref 39.0–52.0)
Hemoglobin: 8.5 g/dL — ABNORMAL LOW (ref 13.0–17.0)
Immature Granulocytes: 2 %
Lymphocytes Relative: 15 %
Lymphs Abs: 2.7 10*3/uL (ref 0.7–4.0)
MCH: 27.6 pg (ref 26.0–34.0)
MCHC: 33.1 g/dL (ref 30.0–36.0)
MCV: 83.4 fL (ref 80.0–100.0)
Monocytes Absolute: 1.8 10*3/uL — ABNORMAL HIGH (ref 0.1–1.0)
Monocytes Relative: 10 %
Neutro Abs: 12.6 10*3/uL — ABNORMAL HIGH (ref 1.7–7.7)
Neutrophils Relative %: 73 %
Platelets: 547 10*3/uL — ABNORMAL HIGH (ref 150–400)
RBC: 3.08 MIL/uL — ABNORMAL LOW (ref 4.22–5.81)
RDW: 14.1 % (ref 11.5–15.5)
WBC: 17.3 10*3/uL — ABNORMAL HIGH (ref 4.0–10.5)
nRBC: 0 % (ref 0.0–0.2)

## 2020-10-04 LAB — LACTIC ACID, PLASMA: Lactic Acid, Venous: 1 mmol/L (ref 0.5–1.9)

## 2020-10-04 LAB — POC OCCULT BLOOD, ED: Fecal Occult Bld: NEGATIVE

## 2020-10-04 LAB — URINALYSIS, ROUTINE W REFLEX MICROSCOPIC
Bilirubin Urine: NEGATIVE
Glucose, UA: NEGATIVE mg/dL
Ketones, ur: NEGATIVE mg/dL
Nitrite: NEGATIVE
Protein, ur: 30 mg/dL — AB
Specific Gravity, Urine: 1.018 (ref 1.005–1.030)
WBC, UA: >50 WBC/hpf — ABNORMAL HIGH (ref 0–5)
pH: 5 (ref 5.0–8.0)

## 2020-10-04 LAB — HEMOGLOBIN AND HEMATOCRIT, BLOOD
HCT: 23 % — ABNORMAL LOW (ref 39.0–52.0)
Hemoglobin: 7.4 g/dL — ABNORMAL LOW (ref 13.0–17.0)

## 2020-10-04 LAB — PREPARE RBC (CROSSMATCH)

## 2020-10-04 LAB — MAGNESIUM: Magnesium: 1.9 mg/dL (ref 1.7–2.4)

## 2020-10-04 LAB — ABO/RH: ABO/RH(D): B POS

## 2020-10-04 LAB — RESP PANEL BY RT-PCR (FLU A&B, COVID) ARPGX2
Influenza A by PCR: NEGATIVE
Influenza B by PCR: NEGATIVE
SARS Coronavirus 2 by RT PCR: NEGATIVE

## 2020-10-04 MED ORDER — POTASSIUM CHLORIDE 10 MEQ/100ML IV SOLN
10.0000 meq | INTRAVENOUS | Status: AC
  Administered 2020-10-04 (×3): 10 meq via INTRAVENOUS
  Filled 2020-10-04 (×3): qty 100

## 2020-10-04 MED ORDER — ONDANSETRON HCL 4 MG PO TABS: 4.0000 mg | ORAL_TABLET | Freq: Four times a day (QID) | ORAL | Status: DC | PRN

## 2020-10-04 MED ORDER — PANTOPRAZOLE SODIUM 40 MG PO TBEC
40.0000 mg | DELAYED_RELEASE_TABLET | Freq: Every day | ORAL | Status: DC
  Administered 2020-10-04 – 2020-10-12 (×8): 40 mg via ORAL
  Filled 2020-10-04 (×9): qty 1

## 2020-10-04 MED ORDER — NICOTINE 21 MG/24HR TD PT24
21.0000 mg | MEDICATED_PATCH | Freq: Every day | TRANSDERMAL | Status: DC
  Administered 2020-10-04 – 2020-10-12 (×9): 21 mg via TRANSDERMAL
  Filled 2020-10-04 (×9): qty 1

## 2020-10-04 MED ORDER — ALBUTEROL SULFATE (2.5 MG/3ML) 0.083% IN NEBU: 2.5000 mg | INHALATION_SOLUTION | Freq: Four times a day (QID) | RESPIRATORY_TRACT | Status: DC | PRN

## 2020-10-04 MED ORDER — ACETAMINOPHEN 500 MG PO TABS
1000.0000 mg | ORAL_TABLET | Freq: Once | ORAL | Status: AC
  Administered 2020-10-04: 1000 mg via ORAL
  Filled 2020-10-04: qty 2

## 2020-10-04 MED ORDER — HYDROCODONE-ACETAMINOPHEN 5-325 MG PO TABS
1.0000 | ORAL_TABLET | Freq: Four times a day (QID) | ORAL | Status: DC | PRN
  Administered 2020-10-04 – 2020-10-12 (×23): 1 via ORAL
  Filled 2020-10-04 (×23): qty 1

## 2020-10-04 MED ORDER — SODIUM CHLORIDE 0.9 % IV SOLN
2.0000 g | Freq: Once | INTRAVENOUS | Status: AC
  Administered 2020-10-04: 2 g via INTRAVENOUS
  Filled 2020-10-04: qty 2

## 2020-10-04 MED ORDER — LACTATED RINGERS IV BOLUS (SEPSIS)
1000.0000 mL | Freq: Once | INTRAVENOUS | Status: AC
  Administered 2020-10-04: 1000 mL via INTRAVENOUS

## 2020-10-04 MED ORDER — VANCOMYCIN HCL 1500 MG/300ML IV SOLN
1500.0000 mg | Freq: Once | INTRAVENOUS | Status: AC
  Administered 2020-10-04: 1500 mg via INTRAVENOUS
  Filled 2020-10-04: qty 300

## 2020-10-04 MED ORDER — SODIUM CHLORIDE 0.9% IV SOLUTION: Freq: Once | INTRAVENOUS | Status: AC

## 2020-10-04 MED ORDER — POTASSIUM CHLORIDE CRYS ER 20 MEQ PO TBCR
30.0000 meq | EXTENDED_RELEASE_TABLET | ORAL | Status: AC
  Administered 2020-10-04: 30 meq via ORAL
  Filled 2020-10-04: qty 2

## 2020-10-04 MED ORDER — ONDANSETRON HCL 4 MG/2ML IJ SOLN: 4.0000 mg | Freq: Four times a day (QID) | INTRAMUSCULAR | Status: DC | PRN

## 2020-10-04 MED ORDER — CLINDAMYCIN PHOSPHATE 600 MG/50ML IV SOLN
600.0000 mg | Freq: Three times a day (TID) | INTRAVENOUS | Status: DC
  Administered 2020-10-04 – 2020-10-07 (×9): 600 mg via INTRAVENOUS
  Filled 2020-10-04 (×10): qty 50

## 2020-10-04 MED ORDER — PIPERACILLIN-TAZOBACTAM 3.375 G IVPB
3.3750 g | Freq: Three times a day (TID) | INTRAVENOUS | Status: DC
  Administered 2020-10-04 – 2020-10-07 (×7): 3.375 g via INTRAVENOUS
  Filled 2020-10-04 (×9): qty 50

## 2020-10-04 MED ORDER — VANCOMYCIN HCL IN DEXTROSE 1-5 GM/200ML-% IV SOLN
1000.0000 mg | Freq: Three times a day (TID) | INTRAVENOUS | Status: DC
  Administered 2020-10-04 – 2020-10-06 (×5): 1000 mg via INTRAVENOUS
  Filled 2020-10-04 (×5): qty 200

## 2020-10-04 MED ORDER — ACETAMINOPHEN 325 MG PO TABS
650.0000 mg | ORAL_TABLET | Freq: Four times a day (QID) | ORAL | Status: DC | PRN
  Administered 2020-10-04 – 2020-10-06 (×3): 650 mg via ORAL
  Filled 2020-10-04 (×3): qty 2

## 2020-10-04 MED ORDER — IOHEXOL 300 MG/ML  SOLN
100.0000 mL | Freq: Once | INTRAMUSCULAR | Status: AC | PRN
  Administered 2020-10-04: 100 mL via INTRAVENOUS

## 2020-10-04 MED ORDER — LACTATED RINGERS IV BOLUS (SEPSIS)
250.0000 mL | Freq: Once | INTRAVENOUS | Status: AC
  Administered 2020-10-04: 250 mL via INTRAVENOUS

## 2020-10-04 MED ORDER — ACETAMINOPHEN 650 MG RE SUPP: 650.0000 mg | Freq: Four times a day (QID) | RECTAL | Status: DC | PRN

## 2020-10-04 NOTE — Progress Notes (Signed)
elink monitoring code sepsis.  
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
## 2020-10-04 NOTE — ED Notes (Signed)
Pt transferred to hospital bed for comfort.

## 2020-10-04 NOTE — ED Notes (Signed)
Patient transported to CT 
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
## 2020-10-04 NOTE — Progress Notes (Deleted)
Patient is a 39 year old male here for follow-up after undergoing debridement of right groin and scrotal wound (25 cm) on 09/09/2020 with Dr. Arita Miss.  At last visit on 11/18 he had some separation of the closure along the superior/anterior aspect of the groin incision and onto the scrotum.  Tissue beneath was healthy with no signs of infection.  He has been doing wet-to-dry dressings.  ~ 3 weeks PO

## 2020-10-04 NOTE — Progress Notes (Signed)
Pharmacy Antibiotic Note  Brandon Glass is a 39 y.o. male admitted on 10/04/2020 with poor healing wound ulcer. Noted recent admission in October 2021 with concern for Fournier's gangrene. Pharmacy has been consulted for Vancomycin + Zosyn dosing.  It is noted that the patient to have paraplegia. Baseline SCr appears to be between 0.7-0.8, so up slightly from that today at 0.9.   Cefepime 2g x 1 given prior to discuss change to Zosyn with MD - will plan to start Zosyn when the next dose of Cefepime would have been due.  Plan: - Vancomycin 1500 mg IV x 1 followed by 1g IV every 8 hours (per review of prior dosing) - Zosyn 3.375g IV every 8 hours (infused over 4 hours) - Will continue to follow renal function, culture results, LOT, and antibiotic de-escalation plans    Height: 6\' 4"  (193 cm) Weight: 72.6 kg (160 lb) IBW/kg (Calculated) : 86.8  Temp (24hrs), Avg:102.1 F (38.9 C), Min:102.1 F (38.9 C), Max:102.1 F (38.9 C)  Recent Labs  Lab 10/04/20 1031  WBC 17.3*  CREATININE 0.90  LATICACIDVEN 1.0    Estimated Creatinine Clearance: 114.3 mL/min (by C-G formula based on SCr of 0.9 mg/dL).    No Known Allergies  Antimicrobials this admission: Vanc 12/7 >> Cefepime 12/7 x 1 Zosyn 12/7 >>  Dose adjustments this admission: n/a  Microbiology results: 12/7 BCx >> 12/7 RCx >>  Thank you for allowing pharmacy to be a part of this patient's care.  14/7, PharmD, BCPS Clinical Pharmacist Clinical phone for 10/04/2020: 14/04/2020 10/04/2020 11:56 AM   **Pharmacist phone directory can now be found on [email or web address] ([email or web address]).  Listed under [facility].

## 2020-10-04 NOTE — ED Notes (Signed)
Pt returned from CT, nad noted 

## 2020-10-04 NOTE — ED Triage Notes (Signed)
Pt arrives to ED with chief complaint of chronic poor healing ulcer, pt is wheel chair bound and has pressure ulcer on bottom. This wound is painful, oozing and foul smelling. Pt began to feel poorly over the last 1 week.

## 2020-10-04 NOTE — Progress Notes (Signed)
IR consulted by Barnetta Chapel, PA-C for possible image-guided pelvic fluid collection aspiraiton/drain placement.  Case/images have been reviewed by Dr. Elby Showers who approves procedure- plan for image-guided pelvic fluid collection aspiration with possible drain placement in IR tentatively for tomorrow 10/05/2020 pending IR scheduling. Patient will be NPO at midnight- please hold all thinners as well. Patient will be seen/consented for procedure tomorrow prior to procedure. Barnetta Chapel, PA-C aware.  Please call IR with questions/concerns.   Waylan Boga Zhyon Antenucci, PA-C 10/04/2020, 4:12 PM

## 2020-10-05 ENCOUNTER — Encounter (HOSPITAL_COMMUNITY): Payer: Self-pay | Admitting: Internal Medicine

## 2020-10-05 ENCOUNTER — Inpatient Hospital Stay (HOSPITAL_COMMUNITY): Payer: Medicaid Other

## 2020-10-05 LAB — TYPE AND SCREEN
ABO/RH(D): B POS
Antibody Screen: NEGATIVE
Unit division: 0

## 2020-10-05 LAB — BASIC METABOLIC PANEL
Anion gap: 10 (ref 5–15)
BUN: 8 mg/dL (ref 6–20)
CO2: 25 mmol/L (ref 22–32)
Calcium: 8.2 mg/dL — ABNORMAL LOW (ref 8.9–10.3)
Chloride: 99 mmol/L (ref 98–111)
Creatinine, Ser: 0.75 mg/dL (ref 0.61–1.24)
GFR, Estimated: >60 mL/min (ref >60)
Glucose, Bld: 109 mg/dL — ABNORMAL HIGH (ref 70–99)
Potassium: 3.7 mmol/L (ref 3.5–5.1)
Sodium: 134 mmol/L — ABNORMAL LOW (ref 135–145)

## 2020-10-05 LAB — CULTURE, BLOOD (ROUTINE X 2)

## 2020-10-05 LAB — CBC
HCT: 26.8 % — ABNORMAL LOW (ref 39.0–52.0)
Hemoglobin: 9.2 g/dL — ABNORMAL LOW (ref 13.0–17.0)
MCH: 29 pg (ref 26.0–34.0)
MCHC: 34.3 g/dL (ref 30.0–36.0)
MCV: 84.5 fL (ref 80.0–100.0)
Platelets: 521 10*3/uL — ABNORMAL HIGH (ref 150–400)
RBC: 3.17 MIL/uL — ABNORMAL LOW (ref 4.22–5.81)
RDW: 14.6 % (ref 11.5–15.5)
WBC: 16.4 10*3/uL — ABNORMAL HIGH (ref 4.0–10.5)
nRBC: 0 % (ref 0.0–0.2)

## 2020-10-05 LAB — BPAM RBC
Blood Product Expiration Date: 202201052359
ISSUE DATE / TIME: 202112071838
Unit Type and Rh: 7300

## 2020-10-05 LAB — PROTIME-INR
INR: 1.4 — ABNORMAL HIGH (ref 0.8–1.2)
Prothrombin Time: 16.2 seconds — ABNORMAL HIGH (ref 11.4–15.2)

## 2020-10-05 LAB — HEMOGLOBIN AND HEMATOCRIT, BLOOD
HCT: 25 % — ABNORMAL LOW (ref 39.0–52.0)
Hemoglobin: 8.7 g/dL — ABNORMAL LOW (ref 13.0–17.0)

## 2020-10-05 LAB — PREALBUMIN: Prealbumin: <5 mg/dL — ABNORMAL LOW (ref 18–38)

## 2020-10-05 MED ORDER — DAKINS (1/4 STRENGTH) 0.125 % EX SOLN
Freq: Two times a day (BID) | CUTANEOUS | Status: AC
  Filled 2020-10-05: qty 473

## 2020-10-05 MED ORDER — FENTANYL CITRATE (PF) 100 MCG/2ML IJ SOLN
INTRAMUSCULAR | Status: AC | PRN
Start: 2020-10-05 — End: 2020-10-05
  Administered 2020-10-05 (×2): 50 ug via INTRAVENOUS

## 2020-10-05 MED ORDER — MIDAZOLAM HCL 2 MG/2ML IJ SOLN
INTRAMUSCULAR | Status: AC | PRN
  Administered 2020-10-05 (×2): 1 mg via INTRAVENOUS

## 2020-10-05 MED ORDER — FENTANYL CITRATE (PF) 100 MCG/2ML IJ SOLN
INTRAMUSCULAR | Status: AC
  Filled 2020-10-05: qty 4

## 2020-10-05 MED ORDER — MIDAZOLAM HCL 2 MG/2ML IJ SOLN
INTRAMUSCULAR | Status: AC
  Filled 2020-10-05: qty 4

## 2020-10-05 MED ORDER — LIDOCAINE HCL 1 % IJ SOLN
INTRAMUSCULAR | Status: AC
  Filled 2020-10-05: qty 20

## 2020-10-05 MED ORDER — SODIUM CHLORIDE 0.9% FLUSH
5.0000 mL | Freq: Three times a day (TID) | INTRAVENOUS | Status: DC
  Administered 2020-10-05 – 2020-10-12 (×10): 5 mL

## 2020-10-05 MED ORDER — ENOXAPARIN SODIUM 40 MG/0.4ML ~~LOC~~ SOLN
40.0000 mg | SUBCUTANEOUS | Status: DC
  Administered 2020-10-05 – 2020-10-11 (×7): 40 mg via SUBCUTANEOUS
  Filled 2020-10-05 (×8): qty 0.4

## 2020-10-05 MED ORDER — SODIUM CHLORIDE 0.9 % IV BOLUS
1000.0000 mL | Freq: Once | INTRAVENOUS | Status: AC
  Administered 2020-10-05: 1000 mL via INTRAVENOUS

## 2020-10-05 MED ORDER — SODIUM CHLORIDE 0.9 % IV SOLN: INTRAVENOUS | Status: DC

## 2020-10-05 NOTE — Procedures (Signed)
Interventional Radiology Procedure Note  Procedure: CT guided placement of 49F drain into right perirectal abscess vis transgluteal approach.   Complications: None  Estimated Blood Loss: None  Recommendations: - Drain to JP bulb - Cultures sent - Flush drain at least once per shift    Signed,  Sterling Big, MD
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
## 2020-10-05 NOTE — ED Notes (Signed)
Pt transported to IR 

## 2020-10-05 NOTE — Consult Note (Addendum)
WOC Nurse Consult Note: Reason for Consult:Patient known to our department from previous admission. Last seen by my associate M. Austin on 08/17/2020. Scrotal wound is followed by Dr. Arita Miss (Plastics) and is healing well. Last seen by that provider on 09/15/2020. [facility] PA L. Simaan assessed patient past pm and surgeon to assess this am (Dr. Corliss Skains). Please see Ms. Simaan's note. In the interim, topical care orders are provided. Wound type:Pressure, Stage 4 with suspected infection (abscess) Pressure Injury POA: Yes Measurement: 5cm x 6cm x 0.5cm with tunnel tracking toward rectum measuring 6.2cm Wound bed:red, moist Drainage (amount, consistency, odor) heavy, malodorous Periwound:intact, dry Dressing procedure/placement/frequency: I have provided guidance for Nursing for twice daily wound care for 3 days using sodium hypochlorite solution moistened gauze to obliterate the dead space and to thereafter use normal saline moistened gauze. Of course, orders provided by the surgical team will supercede any I have provided and will change following any surgical intervention. A mattress replacement has been ordered as well as positioning guidance to have HOB at or below a 30 degree angle. Patient prefers to have dressings secured by donning mesh briefs vs tape.  HHRN has not been provided in the past.  Patient's brother and/or male friends have assisted him with dressing changes.  WOC nursing team will not follow, but will remain available to this patient, the nursing and medical teams.  Please re-consult if needed. Thanks, Ladona Mow, MSN, RN, GNP, [place]  Pager# [phone]

## 2020-10-05 NOTE — Progress Notes (Signed)
   10/05/20 1552  Assess: MEWS Score  Temp 99.7 F (37.6 C)  BP (!) 94/49  Pulse Rate (!) 118  ECG Heart Rate (!) 118  Resp 15  SpO2 100 %  O2 Device Room Air  Assess: MEWS Score  MEWS Temp 0  MEWS Systolic 1  MEWS Pulse 2  MEWS RR 0  MEWS LOC 0  MEWS Score 3  MEWS Score Color Yellow  Assess: if the MEWS score is Yellow or Red  Were vital signs taken at a resting state? Yes  Focused Assessment No change from prior assessment  Early Detection of Sepsis Score *See Row Information* Medium  MEWS guidelines implemented *See Row Information* Yes  Treat  MEWS Interventions Escalated (See documentation below)  Pain Scale 0-10  Pain Score 0  Take Vital Signs  Increase Vital Sign Frequency  Yellow: Q 2hr X 2 then Q 4hr X 2, if remains yellow, continue Q 4hrs  Escalate  MEWS: Escalate Yellow: discuss with charge nurse/RN and consider discussing with provider and RRT  Notify: Charge Nurse/RN  Name of Charge Nurse/RN Notified Judeth Cornfield RN  Date Charge Nurse/RN Notified 10/05/20  Time Charge Nurse/RN Notified 1552  Document  Patient Outcome Stabilized after interventions  Progress note created (see row info) Yes  Received pt with sepsis.  On IV ABT.  Temperature coming down.

## 2020-10-05 NOTE — ED Notes (Signed)
Pt returned from IR, nad noted. Pt given popsicle upon request, pt no longer NPO
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
## 2020-10-06 ENCOUNTER — Ambulatory Visit: Payer: Medicaid Other | Admitting: Plastic Surgery

## 2020-10-06 DIAGNOSIS — S31109D Unspecified open wound of abdominal wall, unspecified quadrant without penetration into peritoneal cavity, subsequent encounter: Secondary | ICD-10-CM

## 2020-10-06 LAB — CBC WITH DIFFERENTIAL/PLATELET
Abs Immature Granulocytes: 0.19 10*3/uL — ABNORMAL HIGH (ref 0.00–0.07)
Basophils Absolute: 0 10*3/uL (ref 0.0–0.1)
Basophils Relative: 0 %
Eosinophils Absolute: 0.1 10*3/uL (ref 0.0–0.5)
Eosinophils Relative: 1 %
HCT: 25.8 % — ABNORMAL LOW (ref 39.0–52.0)
Hemoglobin: 8.9 g/dL — ABNORMAL LOW (ref 13.0–17.0)
Immature Granulocytes: 1 %
Lymphocytes Relative: 19 %
Lymphs Abs: 2.8 10*3/uL (ref 0.7–4.0)
MCH: 28.9 pg (ref 26.0–34.0)
MCHC: 34.5 g/dL (ref 30.0–36.0)
MCV: 83.8 fL (ref 80.0–100.0)
Monocytes Absolute: 1.4 10*3/uL — ABNORMAL HIGH (ref 0.1–1.0)
Monocytes Relative: 9 %
Neutro Abs: 10.7 10*3/uL — ABNORMAL HIGH (ref 1.7–7.7)
Neutrophils Relative %: 70 %
Platelets: 556 10*3/uL — ABNORMAL HIGH (ref 150–400)
RBC: 3.08 MIL/uL — ABNORMAL LOW (ref 4.22–5.81)
RDW: 14.8 % (ref 11.5–15.5)
WBC: 15.3 10*3/uL — ABNORMAL HIGH (ref 4.0–10.5)
nRBC: 0 % (ref 0.0–0.2)

## 2020-10-06 LAB — VANCOMYCIN, TROUGH: Vancomycin Tr: 11 ug/mL — ABNORMAL LOW (ref 15–20)

## 2020-10-06 LAB — BASIC METABOLIC PANEL
Anion gap: 10 (ref 5–15)
BUN: 9 mg/dL (ref 6–20)
CO2: 27 mmol/L (ref 22–32)
Calcium: 8.2 mg/dL — ABNORMAL LOW (ref 8.9–10.3)
Chloride: 98 mmol/L (ref 98–111)
Creatinine, Ser: 0.84 mg/dL (ref 0.61–1.24)
GFR, Estimated: >60 mL/min (ref >60)
Glucose, Bld: 126 mg/dL — ABNORMAL HIGH (ref 70–99)
Potassium: 3.2 mmol/L — ABNORMAL LOW (ref 3.5–5.1)
Sodium: 135 mmol/L (ref 135–145)

## 2020-10-06 LAB — MAGNESIUM: Magnesium: 1.8 mg/dL (ref 1.7–2.4)

## 2020-10-06 MED ORDER — VANCOMYCIN HCL IN DEXTROSE 1-5 GM/200ML-% IV SOLN
1000.0000 mg | Freq: Three times a day (TID) | INTRAVENOUS | Status: DC
  Administered 2020-10-06 – 2020-10-07 (×3): 1000 mg via INTRAVENOUS
  Filled 2020-10-06 (×4): qty 200

## 2020-10-06 MED ORDER — ADULT MULTIVITAMIN W/MINERALS CH
1.0000 | ORAL_TABLET | Freq: Every day | ORAL | Status: DC
  Administered 2020-10-06 – 2020-10-12 (×7): 1 via ORAL
  Filled 2020-10-06 (×7): qty 1

## 2020-10-06 MED ORDER — JUVEN PO PACK
1.0000 | PACK | Freq: Two times a day (BID) | ORAL | Status: DC
  Administered 2020-10-06 – 2020-10-12 (×7): 1 via ORAL
  Filled 2020-10-06 (×8): qty 1

## 2020-10-06 MED ORDER — ENSURE ENLIVE PO LIQD
237.0000 mL | Freq: Two times a day (BID) | ORAL | Status: DC
  Administered 2020-10-06: 237 mL via ORAL

## 2020-10-06 MED ORDER — POTASSIUM CHLORIDE CRYS ER 20 MEQ PO TBCR
40.0000 meq | EXTENDED_RELEASE_TABLET | Freq: Once | ORAL | Status: AC
  Administered 2020-10-06: 40 meq via ORAL
  Filled 2020-10-06: qty 2
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
## 2020-10-06 NOTE — Progress Notes (Signed)
Pharmacy Antibiotic Note  Brandon Glass is a 39 y.o. male admitted on 10/04/2020 with poor healing wound ulcer. Noted recent admission in October 2021 with concern for Fournier's gangrene. Pharmacy has been consulted for Vancomycin  dosing.  It is noted that the patient to have paraplegia. Baseline SCr appears to be between 0.7-0.8, so up slightly from that today at 0.84.   Vancomycin trough drawn at 8 hours=11 mcg/ml which is within acceptable range of 10-20 mcg/ml  Plan: - Continue Vancomycin  1g IV every 8 hours  - Continue Zosyn 3.375g IV every 8 hours (infused over 4 hours) - Will continue to follow renal function, culture results, LOT, and antibiotic de-escalation plans    Height: 6\' 4"  (193 cm) Weight: 72.6 kg (160 lb) IBW/kg (Calculated) : 86.8  Temp (24hrs), Avg:99.4 F (37.4 C), Min:98.4 F (36.9 C), Max:99.9 F (37.7 C)  Recent Labs  Lab 10/04/20 1031 10/05/20 0301 10/06/20 0211 10/06/20 1656  WBC 17.3* 16.4* 15.3*  --   CREATININE 0.90 0.75 0.84  --   LATICACIDVEN 1.0  --   --   --   VANCOTROUGH  --   --   --  11*    Estimated Creatinine Clearance: 122.4 mL/min (by C-G formula based on SCr of 0.84 mg/dL).    No Known Allergies  Antimicrobials this admission: Vanc 12/7 >> Cefepime 12/7 x 1 Zosyn 12/7 >>  Dose adjustments this admission: VT 11, continue current dose.   Microbiology results: 12/7 BCx >> 12/7 RCx >> 12/8 Abscess>>GPC, GPR, GNR  Graycie Halley A. 14/8, PharmD, BCPS, FNKF Clinical Pharmacist  Please utilize Amion for appropriate phone number to reach the unit pharmacist [facility])    **Pharmacist phone directory can now be found on [email or web address] ([email or web address]).  Listed under [facility].
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
## 2020-10-07 LAB — BASIC METABOLIC PANEL
Anion gap: 11 (ref 5–15)
BUN: 13 mg/dL (ref 6–20)
CO2: 24 mmol/L (ref 22–32)
Calcium: 8 mg/dL — ABNORMAL LOW (ref 8.9–10.3)
Chloride: 100 mmol/L (ref 98–111)
Creatinine, Ser: 1.7 mg/dL — ABNORMAL HIGH (ref 0.61–1.24)
GFR, Estimated: 52 mL/min — ABNORMAL LOW (ref >60)
Glucose, Bld: 104 mg/dL — ABNORMAL HIGH (ref 70–99)
Potassium: 3.8 mmol/L (ref 3.5–5.1)
Sodium: 135 mmol/L (ref 135–145)

## 2020-10-07 LAB — CBC WITH DIFFERENTIAL/PLATELET
Abs Immature Granulocytes: 0.17 10*3/uL — ABNORMAL HIGH (ref 0.00–0.07)
Basophils Absolute: 0 10*3/uL (ref 0.0–0.1)
Basophils Relative: 0 %
Eosinophils Absolute: 0.2 10*3/uL (ref 0.0–0.5)
Eosinophils Relative: 1 %
HCT: 26.6 % — ABNORMAL LOW (ref 39.0–52.0)
Hemoglobin: 8.8 g/dL — ABNORMAL LOW (ref 13.0–17.0)
Immature Granulocytes: 1 %
Lymphocytes Relative: 17 %
Lymphs Abs: 2.5 10*3/uL (ref 0.7–4.0)
MCH: 27.8 pg (ref 26.0–34.0)
MCHC: 33.1 g/dL (ref 30.0–36.0)
MCV: 84.2 fL (ref 80.0–100.0)
Monocytes Absolute: 1.8 10*3/uL — ABNORMAL HIGH (ref 0.1–1.0)
Monocytes Relative: 12 %
Neutro Abs: 10.2 10*3/uL — ABNORMAL HIGH (ref 1.7–7.7)
Neutrophils Relative %: 69 %
Platelets: 521 10*3/uL — ABNORMAL HIGH (ref 150–400)
RBC: 3.16 MIL/uL — ABNORMAL LOW (ref 4.22–5.81)
RDW: 14.8 % (ref 11.5–15.5)
WBC: 14.8 10*3/uL — ABNORMAL HIGH (ref 4.0–10.5)
nRBC: 0 % (ref 0.0–0.2)

## 2020-10-07 LAB — URINE CULTURE: Culture: >=100000 — AB

## 2020-10-07 LAB — CREATININE, SERUM
Creatinine, Ser: 1.95 mg/dL — ABNORMAL HIGH (ref 0.61–1.24)
GFR, Estimated: 44 mL/min — ABNORMAL LOW (ref >60)

## 2020-10-07 MED ORDER — ENSURE ENLIVE PO LIQD
237.0000 mL | Freq: Three times a day (TID) | ORAL | Status: DC
  Administered 2020-10-07 – 2020-10-08 (×2): 237 mL via ORAL

## 2020-10-07 MED ORDER — SODIUM CHLORIDE 0.9 % IV SOLN
2.0000 g | INTRAVENOUS | Status: DC
  Administered 2020-10-07 – 2020-10-10 (×4): 2 g via INTRAVENOUS
  Filled 2020-10-07: qty 20
  Filled 2020-10-07 (×2): qty 2
  Filled 2020-10-07: qty 20
  Filled 2020-10-07: qty 2
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
## 2020-10-08 LAB — BASIC METABOLIC PANEL
Anion gap: 9 (ref 5–15)
BUN: 20 mg/dL (ref 6–20)
CO2: 28 mmol/L (ref 22–32)
Calcium: 8.4 mg/dL — ABNORMAL LOW (ref 8.9–10.3)
Chloride: 99 mmol/L (ref 98–111)
Creatinine, Ser: 1.77 mg/dL — ABNORMAL HIGH (ref 0.61–1.24)
GFR, Estimated: 50 mL/min — ABNORMAL LOW (ref >60)
Glucose, Bld: 107 mg/dL — ABNORMAL HIGH (ref 70–99)
Potassium: 4.2 mmol/L (ref 3.5–5.1)
Sodium: 136 mmol/L (ref 135–145)

## 2020-10-08 LAB — CBC WITH DIFFERENTIAL/PLATELET
Abs Immature Granulocytes: 0.2 10*3/uL — ABNORMAL HIGH (ref 0.00–0.07)
Basophils Absolute: 0 10*3/uL (ref 0.0–0.1)
Basophils Relative: 0 %
Eosinophils Absolute: 0.2 10*3/uL (ref 0.0–0.5)
Eosinophils Relative: 1 %
HCT: 26 % — ABNORMAL LOW (ref 39.0–52.0)
Hemoglobin: 8.6 g/dL — ABNORMAL LOW (ref 13.0–17.0)
Immature Granulocytes: 1 %
Lymphocytes Relative: 17 %
Lymphs Abs: 2.5 10*3/uL (ref 0.7–4.0)
MCH: 28.1 pg (ref 26.0–34.0)
MCHC: 33.1 g/dL (ref 30.0–36.0)
MCV: 85 fL (ref 80.0–100.0)
Monocytes Absolute: 1.5 10*3/uL — ABNORMAL HIGH (ref 0.1–1.0)
Monocytes Relative: 10 %
Neutro Abs: 10.1 10*3/uL — ABNORMAL HIGH (ref 1.7–7.7)
Neutrophils Relative %: 71 %
Platelets: 608 10*3/uL — ABNORMAL HIGH (ref 150–400)
RBC: 3.06 MIL/uL — ABNORMAL LOW (ref 4.22–5.81)
RDW: 15.2 % (ref 11.5–15.5)
WBC: 14.6 10*3/uL — ABNORMAL HIGH (ref 4.0–10.5)
nRBC: 0 % (ref 0.0–0.2)
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
## 2020-10-08 NOTE — Plan of Care (Signed)
  Problem: Education: Goal: Knowledge of General Education information will improve Description: Including pain rating scale, medication(s)/side effects and non-pharmacologic comfort measures Outcome: Progressing   Problem: Fluid Volume: Goal: Hemodynamic stability will improve Outcome: Progressing   Problem: Clinical Measurements: Goal: Diagnostic test results will improve Outcome: Progressing Goal: Signs and symptoms of infection will decrease Outcome: Progressing   Problem: Respiratory: Goal: Ability to maintain adequate ventilation will improve Outcome: Progressing   

## 2020-10-09 LAB — CBC WITH DIFFERENTIAL/PLATELET
Abs Immature Granulocytes: 0.09 10*3/uL — ABNORMAL HIGH (ref 0.00–0.07)
Basophils Absolute: 0.1 10*3/uL (ref 0.0–0.1)
Basophils Relative: 0 %
Eosinophils Absolute: 0.2 10*3/uL (ref 0.0–0.5)
Eosinophils Relative: 2 %
HCT: 29.7 % — ABNORMAL LOW (ref 39.0–52.0)
Hemoglobin: 9.1 g/dL — ABNORMAL LOW (ref 13.0–17.0)
Immature Granulocytes: 1 %
Lymphocytes Relative: 25 %
Lymphs Abs: 2.9 10*3/uL (ref 0.7–4.0)
MCH: 27.7 pg (ref 26.0–34.0)
MCHC: 30.6 g/dL (ref 30.0–36.0)
MCV: 90.5 fL (ref 80.0–100.0)
Monocytes Absolute: 1.3 10*3/uL — ABNORMAL HIGH (ref 0.1–1.0)
Monocytes Relative: 11 %
Neutro Abs: 7 10*3/uL (ref 1.7–7.7)
Neutrophils Relative %: 61 %
Platelets: 640 10*3/uL — ABNORMAL HIGH (ref 150–400)
RBC: 3.28 MIL/uL — ABNORMAL LOW (ref 4.22–5.81)
RDW: 15.6 % — ABNORMAL HIGH (ref 11.5–15.5)
WBC: 11.5 10*3/uL — ABNORMAL HIGH (ref 4.0–10.5)
nRBC: 0 % (ref 0.0–0.2)

## 2020-10-09 LAB — BASIC METABOLIC PANEL
Anion gap: 11 (ref 5–15)
BUN: 16 mg/dL (ref 6–20)
CO2: 24 mmol/L (ref 22–32)
Calcium: 8.3 mg/dL — ABNORMAL LOW (ref 8.9–10.3)
Chloride: 103 mmol/L (ref 98–111)
Creatinine, Ser: 1.56 mg/dL — ABNORMAL HIGH (ref 0.61–1.24)
GFR, Estimated: 58 mL/min — ABNORMAL LOW (ref >60)
Glucose, Bld: 96 mg/dL (ref 70–99)
Potassium: 4.8 mmol/L (ref 3.5–5.1)
Sodium: 138 mmol/L (ref 135–145)

## 2020-10-09 LAB — CULTURE, BLOOD (ROUTINE X 2)
Culture: NO GROWTH
Culture: NO GROWTH
Special Requests: ADEQUATE
Special Requests: ADEQUATE
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
## 2020-10-10 LAB — CBC WITH DIFFERENTIAL/PLATELET
Abs Immature Granulocytes: 0.09 10*3/uL — ABNORMAL HIGH (ref 0.00–0.07)
Basophils Absolute: 0 10*3/uL (ref 0.0–0.1)
Basophils Relative: 0 %
Eosinophils Absolute: 0.2 10*3/uL (ref 0.0–0.5)
Eosinophils Relative: 2 %
HCT: 25.2 % — ABNORMAL LOW (ref 39.0–52.0)
Hemoglobin: 8.1 g/dL — ABNORMAL LOW (ref 13.0–17.0)
Immature Granulocytes: 1 %
Lymphocytes Relative: 29 %
Lymphs Abs: 3.1 10*3/uL (ref 0.7–4.0)
MCH: 27.6 pg (ref 26.0–34.0)
MCHC: 32.1 g/dL (ref 30.0–36.0)
MCV: 86 fL (ref 80.0–100.0)
Monocytes Absolute: 1.2 10*3/uL — ABNORMAL HIGH (ref 0.1–1.0)
Monocytes Relative: 12 %
Neutro Abs: 5.9 10*3/uL (ref 1.7–7.7)
Neutrophils Relative %: 56 %
Platelets: 671 10*3/uL — ABNORMAL HIGH (ref 150–400)
RBC: 2.93 MIL/uL — ABNORMAL LOW (ref 4.22–5.81)
RDW: 15.3 % (ref 11.5–15.5)
WBC: 10.5 10*3/uL (ref 4.0–10.5)
nRBC: 0 % (ref 0.0–0.2)

## 2020-10-10 LAB — AEROBIC/ANAEROBIC CULTURE W GRAM STAIN (SURGICAL/DEEP WOUND)

## 2020-10-10 LAB — BASIC METABOLIC PANEL
Anion gap: 10 (ref 5–15)
BUN: 12 mg/dL (ref 6–20)
CO2: 25 mmol/L (ref 22–32)
Calcium: 8.4 mg/dL — ABNORMAL LOW (ref 8.9–10.3)
Chloride: 104 mmol/L (ref 98–111)
Creatinine, Ser: 1.53 mg/dL — ABNORMAL HIGH (ref 0.61–1.24)
GFR, Estimated: 59 mL/min — ABNORMAL LOW (ref >60)
Glucose, Bld: 93 mg/dL (ref 70–99)
Potassium: 4.2 mmol/L (ref 3.5–5.1)
Sodium: 139 mmol/L (ref 135–145)

## 2020-10-10 NOTE — Plan of Care (Signed)
  Problem: Education: Goal: Knowledge of General Education information will improve Description Including pain rating scale, medication(s)/side effects and non-pharmacologic comfort measures Outcome: Progressing   
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
## 2020-10-10 NOTE — Progress Notes (Signed)
Referring Physician(s): Dr Matt Holmes Dr Sheron Nightingale  Supervising Physician: Gilmer Mor  Patient Status:  [facility] - In-pt  Chief Complaint:  History of paraplegia with chronic right inferior gluteal decubitus ulcer complicated by right perirectal abscess s/p right TG drain placement in IR 10/05/2020.  Subjective:  Up in bed Good spirits Says pain from drain seems to be up into right low back   Allergies: Patient has no known allergies.  Medications: Prior to Admission medications   Medication Sig Start Date End Date Taking? Authorizing Provider  ibuprofen (ADVIL) 200 MG tablet Take 400 mg by mouth as needed for mild pain or moderate pain.   Yes [provider]  acetaminophen (TYLENOL) 500 MG tablet Take 1 tablet (500 mg total) by mouth every 6 (six) hours as needed. For use AFTER surgery Patient not taking: Reported on 10/04/2020 09/09/20   Eldridge Abrahams, PA-C  ibuprofen (ADVIL) 600 MG tablet Take 1 tablet (600 mg total) by mouth every 6 (six) hours as needed for mild pain or moderate pain. For use AFTER surgery Patient not taking: Reported on 10/04/2020 09/09/20   Eldridge Abrahams, PA-C     Vital Signs: BP 118/74 (BP Location: Right Arm)   Pulse 85   Temp 99.2 F (37.3 C) (Oral)   Resp 12   Ht 6\' 4"  (1.93 m)   Wt 186 lb 15.2 oz (84.8 kg)   SpO2 99%   BMI 22.76 kg/m   Physical Exam Skin:    General: Skin is warm.     Comments: Site is clean and dry Tender superior to drain  OP milky purulent 50 cc yesterday; 15 cc in JP  Organism ID, Bacteria PROTEUS MIRABILIS  Organism ID, Bacteria ESCHERICHIA COLI        Imaging: No results found.  Labs:  CBC: Recent Labs    10/07/20 0037 10/08/20 0144 10/09/20 0527 10/10/20 0615  WBC 14.8* 14.6* 11.5* 10.5  HGB 8.8* 8.6* 9.1* 8.1*  HCT 26.6* 26.0* 29.7* 25.2*  PLT 521* 608* 640* 671*    COAGS: Recent Labs    08/16/20 2011 10/04/20 1031 10/05/20 0301  INR 1.2 1.3* 1.4*  APTT 38*  --   --      BMP: Recent Labs    06/22/20 1145 08/16/20 0909 10/07/20 0037 10/07/20 1236 10/08/20 0144 10/09/20 0527 10/10/20 0615  NA 138   < > 135  --  136 138 139  K 4.2   < > 3.8  --  4.2 4.8 4.2  CL 100   < > 100  --  99 103 104  CO2 25   < > 24  --  28 24 25   GLUCOSE 86   < > 104*  --  107* 96 93  BUN 13   < > 13  --  20 16 12   CALCIUM 10.0   < > 8.0*  --  8.4* 8.3* 8.4*  CREATININE 0.96   < > 1.70* 1.95* 1.77* 1.56* 1.53*  GFRNONAA 100   < > 52* 44* 50* 58* 59*  GFRAA 115  --   --   --   --   --   --    < > = values in this interval not displayed.    LIVER FUNCTION TESTS: Recent Labs    08/21/20 0136 08/22/20 0416 09/09/20 0847 10/04/20 1031  BILITOT 0.8 0.8 0.6 1.0  AST 23 31 17 27   ALT 19 24 16 27   ALKPHOS 51 48  66 88  PROT 6.2* 6.3* 7.8 6.9  ALBUMIN 2.3* 2.5* 3.5 2.1*    Assessment and Plan:  Rt TG drain intact OP milky and purulent; copious daily still Will follow  Electronically Signed: Robet Leu, PA-C 10/10/2020, 10:45 AM   I spent a total of 15 Minutes at the the patient's bedside AND on the patient's hospital floor or unit, greater than 50% of which was counseling/coordinating care for TG abscess drain
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
## 2020-10-11 ENCOUNTER — Other Ambulatory Visit: Payer: Self-pay | Admitting: Radiology

## 2020-10-11 DIAGNOSIS — G8220 Paraplegia, unspecified: Secondary | ICD-10-CM

## 2020-10-11 LAB — CBC WITH DIFFERENTIAL/PLATELET
Abs Immature Granulocytes: 0.09 10*3/uL — ABNORMAL HIGH (ref 0.00–0.07)
Basophils Absolute: 0 10*3/uL (ref 0.0–0.1)
Basophils Relative: 0 %
Eosinophils Absolute: 0.2 10*3/uL (ref 0.0–0.5)
Eosinophils Relative: 2 %
HCT: 25.3 % — ABNORMAL LOW (ref 39.0–52.0)
Hemoglobin: 8.1 g/dL — ABNORMAL LOW (ref 13.0–17.0)
Immature Granulocytes: 1 %
Lymphocytes Relative: 28 %
Lymphs Abs: 2.9 10*3/uL (ref 0.7–4.0)
MCH: 27.6 pg (ref 26.0–34.0)
MCHC: 32 g/dL (ref 30.0–36.0)
MCV: 86.1 fL (ref 80.0–100.0)
Monocytes Absolute: 1 10*3/uL (ref 0.1–1.0)
Monocytes Relative: 10 %
Neutro Abs: 5.9 10*3/uL (ref 1.7–7.7)
Neutrophils Relative %: 59 %
Platelets: 712 10*3/uL — ABNORMAL HIGH (ref 150–400)
RBC: 2.94 MIL/uL — ABNORMAL LOW (ref 4.22–5.81)
RDW: 15.3 % (ref 11.5–15.5)
WBC: 10.1 10*3/uL (ref 4.0–10.5)
nRBC: 0 % (ref 0.0–0.2)

## 2020-10-11 LAB — BASIC METABOLIC PANEL
Anion gap: 10 (ref 5–15)
BUN: 11 mg/dL (ref 6–20)
CO2: 24 mmol/L (ref 22–32)
Calcium: 8.7 mg/dL — ABNORMAL LOW (ref 8.9–10.3)
Chloride: 103 mmol/L (ref 98–111)
Creatinine, Ser: 1.48 mg/dL — ABNORMAL HIGH (ref 0.61–1.24)
GFR, Estimated: >60 mL/min (ref >60)
Glucose, Bld: 95 mg/dL (ref 70–99)
Potassium: 4.2 mmol/L (ref 3.5–5.1)
Sodium: 137 mmol/L (ref 135–145)

## 2020-10-11 MED ORDER — AMOXICILLIN-POT CLAVULANATE 875-125 MG PO TABS
1.0000 | ORAL_TABLET | Freq: Two times a day (BID) | ORAL | Status: DC
  Administered 2020-10-11 – 2020-10-12 (×3): 1 via ORAL
  Filled 2020-10-11 (×3): qty 1
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
## 2020-10-11 NOTE — Progress Notes (Signed)
Referring Physician(s): Dr Matt Holmes Dr Sheron Nightingale  Supervising Physician: Dr Marliss Coots  Patient Status:  [facility] - In-pt  Chief Complaint:  History of paraplegia with chronic right inferior gluteal decubitus ulcer complicated by right perirectal abscess s/p right TG drain placement in IR 10/05/2020.  Subjective:  Good spirits No complaints  Allergies: Patient has no known allergies.  Medications: Prior to Admission medications   Medication Sig Start Date End Date Taking? Authorizing Provider  ibuprofen (ADVIL) 200 MG tablet Take 400 mg by mouth as needed for mild pain or moderate pain.   Yes [provider]  acetaminophen (TYLENOL) 500 MG tablet Take 1 tablet (500 mg total) by mouth every 6 (six) hours as needed. For use AFTER surgery Patient not taking: Reported on 10/04/2020 09/09/20   Eldridge Abrahams, PA-C  ibuprofen (ADVIL) 600 MG tablet Take 1 tablet (600 mg total) by mouth every 6 (six) hours as needed for mild pain or moderate pain. For use AFTER surgery Patient not taking: Reported on 10/04/2020 09/09/20   Eldridge Abrahams, PA-C     Vital Signs: BP 132/82 (BP Location: Right Arm)   Pulse 89   Temp 98.1 F (36.7 C) (Oral)   Resp 10   Ht 6\' 4"  (1.93 m)   Wt 180 lb 8.9 oz (81.9 kg)   SpO2 96%   BMI 21.98 kg/m   Physical Exam Skin:    General: Skin is warm.     Comments: Site clean and dry Purulent OP Milky yellow 20 cc OP yesterday Organism ID, Bacteria          PROTEUS MIRABILIS  Organism ID, Bacteria          ESCHERICHIA COLI      Imaging: No results found.  Labs:  CBC: Recent Labs    10/08/20 0144 10/09/20 0527 10/10/20 0615 10/11/20 0241  WBC 14.6* 11.5* 10.5 10.1  HGB 8.6* 9.1* 8.1* 8.1*  HCT 26.0* 29.7* 25.2* 25.3*  PLT 608* 640* 671* 712*    COAGS: Recent Labs    08/16/20 2011 10/04/20 1031 10/05/20 0301  INR 1.2 1.3* 1.4*  APTT 38*  --   --     BMP: Recent Labs    06/22/20 1145 08/16/20 0909 10/08/20 0144  10/09/20 0527 10/10/20 0615 10/11/20 0241  NA 138   < > 136 138 139 137  K 4.2   < > 4.2 4.8 4.2 4.2  CL 100   < > 99 103 104 103  CO2 25   < > 28 24 25 24   GLUCOSE 86   < > 107* 96 93 95  BUN 13   < > 20 16 12 11   CALCIUM 10.0   < > 8.4* 8.3* 8.4* 8.7*  CREATININE 0.96   < > 1.77* 1.56* 1.53* 1.48*  GFRNONAA 100   < > 50* 58* 59* >60  GFRAA 115  --   --   --   --   --    < > = values in this interval not displayed.    LIVER FUNCTION TESTS: Recent Labs    08/21/20 0136 08/22/20 0416 09/09/20 0847 10/04/20 1031  BILITOT 0.8 0.8 0.6 1.0  AST 23 31 17 27   ALT 19 24 16 27   ALKPHOS 51 48 66 88  PROT 6.2* 6.3* 7.8 6.9  ALBUMIN 2.3* 2.5* 3.5 2.1*    Assessment and Plan:  Rt TG drain intact OP milky and purulent; copious daily still Will follow  Electronically Signed: Robet Leu, PA-C 10/11/2020, 9:25 AM   I spent a total of 15 Minutes at the the patient's bedside AND on the patient's hospital floor or unit, greater than 50% of which was counseling/coordinating care for TG abscess drain

## 2020-10-11 NOTE — Progress Notes (Signed)
Pt has been on 8d of abx for complicated fournier's gangrene. Abscess culture grew out proteus and e.coli with some anaerobes. CCS plan to treat for 14d. D/w Dr Enzo Montgomery and we will transition to PO Augmentin to complete the therapy.  Augmentin 875mg  PO BID x 6 more days  , PharmD, [place], AAHIVP, CPP Infectious Disease Pharmacist 10/11/2020 1:41 PM
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
## 2020-10-12 ENCOUNTER — Other Ambulatory Visit (HOSPITAL_COMMUNITY): Payer: Self-pay | Admitting: Internal Medicine

## 2020-10-12 LAB — BASIC METABOLIC PANEL
Anion gap: 11 (ref 5–15)
BUN: 8 mg/dL (ref 6–20)
CO2: 24 mmol/L (ref 22–32)
Calcium: 9 mg/dL (ref 8.9–10.3)
Chloride: 101 mmol/L (ref 98–111)
Creatinine, Ser: 1.47 mg/dL — ABNORMAL HIGH (ref 0.61–1.24)
GFR, Estimated: >60 mL/min (ref >60)
Glucose, Bld: 97 mg/dL (ref 70–99)
Potassium: 4.3 mmol/L (ref 3.5–5.1)
Sodium: 136 mmol/L (ref 135–145)

## 2020-10-12 LAB — CBC WITH DIFFERENTIAL/PLATELET
Abs Immature Granulocytes: 0.08 10*3/uL — ABNORMAL HIGH (ref 0.00–0.07)
Basophils Absolute: 0 10*3/uL (ref 0.0–0.1)
Basophils Relative: 0 %
Eosinophils Absolute: 0.2 10*3/uL (ref 0.0–0.5)
Eosinophils Relative: 2 %
HCT: 26.4 % — ABNORMAL LOW (ref 39.0–52.0)
Hemoglobin: 8.5 g/dL — ABNORMAL LOW (ref 13.0–17.0)
Immature Granulocytes: 1 %
Lymphocytes Relative: 27 %
Lymphs Abs: 3.1 10*3/uL (ref 0.7–4.0)
MCH: 27.6 pg (ref 26.0–34.0)
MCHC: 32.2 g/dL (ref 30.0–36.0)
MCV: 85.7 fL (ref 80.0–100.0)
Monocytes Absolute: 1.1 10*3/uL — ABNORMAL HIGH (ref 0.1–1.0)
Monocytes Relative: 10 %
Neutro Abs: 7 10*3/uL (ref 1.7–7.7)
Neutrophils Relative %: 60 %
Platelets: 679 10*3/uL — ABNORMAL HIGH (ref 150–400)
RBC: 3.08 MIL/uL — ABNORMAL LOW (ref 4.22–5.81)
RDW: 15.5 % (ref 11.5–15.5)
WBC: 11.4 10*3/uL — ABNORMAL HIGH (ref 4.0–10.5)
nRBC: 0 % (ref 0.0–0.2)

## 2020-10-12 MED ORDER — JUVEN PO PACK: 1.0000 | PACK | Freq: Two times a day (BID) | ORAL | 0 refills | Status: DC

## 2020-10-12 MED ORDER — AMOXICILLIN-POT CLAVULANATE 875-125 MG PO TABS: 1.0000 | ORAL_TABLET | Freq: Two times a day (BID) | ORAL | 0 refills | Status: DC

## 2020-10-12 MED ORDER — HYDROCODONE-ACETAMINOPHEN 5-325 MG PO TABS: 1.0000 | ORAL_TABLET | Freq: Two times a day (BID) | ORAL | 0 refills | Status: DC | PRN

## 2020-10-12 MED ORDER — ENSURE ENLIVE PO LIQD: 237.0000 mL | Freq: Three times a day (TID) | ORAL | 0 refills | Status: AC

## 2020-10-12 MED ORDER — NICOTINE 21 MG/24HR TD PT24: 21.0000 mg | MEDICATED_PATCH | Freq: Every day | TRANSDERMAL | 0 refills | Status: DC

## 2020-10-12 MED ORDER — PANTOPRAZOLE SODIUM 40 MG PO TBEC: 40.0000 mg | DELAYED_RELEASE_TABLET | Freq: Every day | ORAL | 2 refills | Status: DC

## 2020-10-12 MED ORDER — ADULT MULTIVITAMIN W/MINERALS CH: 1.0000 | ORAL_TABLET | Freq: Every day | ORAL | Status: DC

## 2020-10-12 MED FILL — HYDROCODON-APAP 5-325: 5-325 | 3 days supply | Qty: 6 | Fill #0

## 2020-10-12 MED FILL — AMOX-CLAV 875-125 MG TABLET: 875-125 | 5 days supply | Qty: 10 | Fill #0

## 2020-10-12 MED FILL — PANTOPRAZOLE SOD DR 40 MG T: 40 | 30 days supply | Qty: 30 | Fill #0

## 2020-10-12 NOTE — Progress Notes (Signed)
Discharged home with Family. Given and explained printed materials including medications details. Verbalized understanding and no further questions expressed. Provided wound care supplies. Transferred out via wheelchair, brother accompanied. Mikal Plane, BSN, RN

## 2020-10-12 NOTE — Plan of Care (Signed)
°  Problem: Education: Goal: Knowledge of General Education information will improve Description: Including pain rating scale, medication(s)/side effects and non-pharmacologic comfort measures Outcome: Progressing   Problem: Fluid Volume: Goal: Hemodynamic stability will improve Outcome: Progressing   Problem: Clinical Measurements: Goal: Diagnostic test results will improve Outcome: Progressing Goal: Signs and symptoms of infection will decrease Outcome: Progressing   Problem: Clinical Measurements: Goal: Diagnostic test results will improve Outcome: Progressing Goal: Signs and symptoms of infection will decrease Outcome: Progressing
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
## 2020-10-12 NOTE — Progress Notes (Signed)
OT Cancellation Note  Patient Details Name: Brandon Glass MRN: [id number] DOB: 1981-10-05   Cancelled Treatment:    Reason Eval/Treat Not Completed: OT screened, no needs identified, will sign off. Pt mod indep at w/c level, managing his condom cath independently  Galen Manila 10/12/2020, 10:00 AM

## 2020-10-12 NOTE — Plan of Care (Signed)
  Problem: Clinical Measurements: Goal: Signs and symptoms of infection will decrease Outcome: Adequate for Discharge   Problem: Clinical Measurements: Goal: Diagnostic test results will improve Outcome: Adequate for Discharge Goal: Signs and symptoms of infection will decrease Outcome: Adequate for Discharge   Problem: Education: Goal: Knowledge of General Education information will improve Description: Including pain rating scale, medication(s)/side effects and non-pharmacologic comfort measures Outcome: Completed/Met   Problem: Fluid Volume: Goal: Hemodynamic stability will improve Outcome: Completed/Met   Problem: Clinical Measurements: Goal: Diagnostic test results will improve Outcome: Completed/Met

## 2020-10-13 NOTE — Discharge Summary (Signed)
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 should not be used as a sole basis for treatment. Nasal washings and aspirates are unacceptable for Xpert Xpress SARS-CoV-2/FLU/RSV testing.  Fact Sheet for Patients: BloggerCourse.com  Fact Sheet for Healthcare  Providers: SeriousBroker.it  This test is not yet approved or cleared by the [place] FDA and has been authorized for detection and/or diagnosis of SARS-CoV-2 by FDA under an Emergency Use Authorization (EUA). This EUA will remain in effect (meaning this test can be used) for the duration of the COVID-19 declaration under Section 564(b)(1) of the Act, 21 U.S.C. section 360bbb-3(b)(1), unless the authorization is terminated or revoked.  Performed at [facility], [address], [place], [place] [address]   Aerobic/Anaerobic Culture (surgical/deep wound)     Status: None   Collection Time: 10/05/20  2:11 PM   Specimen: Abscess  Result Value Ref Range Status   Specimen Description ABSCESS PERIRECTAL  Final   Special Requests NONE  Final   Gram Stain   Final    ABUNDANT WBC PRESENT,BOTH PMN AND MONONUCLEAR MODERATE GRAM POSITIVE COCCI IN PAIRS IN CLUSTERS MODERATE GRAM NEGATIVE RODS FEW GRAM POSITIVE RODS Performed at [facility], [address] [place], [place] [address]    Culture   Final    ABUNDANT PROTEUS MIRABILIS FEW ESCHERICHIA COLI MIXED ANAEROBIC FLORA PRESENT.  CALL LAB IF FURTHER IID REQUIRED.    Report Status 10/10/2020 FINAL  Final   Organism ID, Bacteria PROTEUS MIRABILIS  Final   Organism ID, Bacteria ESCHERICHIA COLI  Final      Susceptibility   Escherichia coli - MIC*    AMPICILLIN 4 SENSITIVE Sensitive     CEFAZOLIN <=4 SENSITIVE Sensitive     CEFEPIME <=0.12 SENSITIVE Sensitive     CEFTAZIDIME <=1 SENSITIVE Sensitive     CEFTRIAXONE <=0.25 SENSITIVE Sensitive     CIPROFLOXACIN <=0.25 SENSITIVE Sensitive     GENTAMICIN <=1 SENSITIVE Sensitive     IMIPENEM <=0.25 SENSITIVE Sensitive     TRIMETH/SULFA <=20 SENSITIVE Sensitive     AMPICILLIN/SULBACTAM <=2 SENSITIVE Sensitive     PIP/TAZO <=4 SENSITIVE Sensitive     * FEW ESCHERICHIA COLI   Proteus mirabilis - MIC*    AMPICILLIN <=2 SENSITIVE Sensitive      CEFAZOLIN 8 SENSITIVE Sensitive     CEFEPIME <=0.12 SENSITIVE Sensitive     CEFTAZIDIME <=1 SENSITIVE Sensitive     CEFTRIAXONE <=0.25 SENSITIVE Sensitive     CIPROFLOXACIN <=0.25 SENSITIVE Sensitive     GENTAMICIN <=1 SENSITIVE Sensitive     IMIPENEM 4 SENSITIVE Sensitive     TRIMETH/SULFA <=20 SENSITIVE Sensitive     AMPICILLIN/SULBACTAM <=2 SENSITIVE Sensitive     PIP/TAZO <=4 SENSITIVE Sensitive     * ABUNDANT PROTEUS MIRABILIS     Time coordinating discharge: 32 minutes.  SIGNED:  Hosie Poisson, MD  [facility]

## 2020-11-16 ENCOUNTER — Encounter (HOSPITAL_BASED_OUTPATIENT_CLINIC_OR_DEPARTMENT_OTHER): Payer: Medicaid Other | Admitting: Physician Assistant

## 2020-12-13 ENCOUNTER — Other Ambulatory Visit: Payer: Self-pay | Admitting: Radiology

## 2020-12-13 DIAGNOSIS — K611 Rectal abscess: Secondary | ICD-10-CM

## 2020-12-13 DIAGNOSIS — G8220 Paraplegia, unspecified: Secondary | ICD-10-CM

## 2020-12-19 ENCOUNTER — Ambulatory Visit (HOSPITAL_COMMUNITY)
Admission: RE | Admit: 2020-12-19 | Discharge: 2020-12-19 | Disposition: A | Discharge status: Home / Self Care | Payer: Medicaid Other | Source: Ambulatory Visit | Attending: Radiology | Admitting: Radiology

## 2020-12-19 ENCOUNTER — Other Ambulatory Visit: Payer: Self-pay

## 2020-12-19 DIAGNOSIS — G8220 Paraplegia, unspecified: Secondary | ICD-10-CM

## 2020-12-19 DIAGNOSIS — Z4803 Encounter for change or removal of drains: Secondary | ICD-10-CM | POA: Insufficient documentation

## 2020-12-19 DIAGNOSIS — K611 Rectal abscess: Secondary | ICD-10-CM | POA: Diagnosis present

## 2020-12-19 HISTORY — PX: IR SINUS/FIST TUBE CHK-NON GI: IMG673

## 2020-12-19 MED ORDER — IOHEXOL 300 MG/ML  SOLN
50.0000 mL | Freq: Once | INTRAMUSCULAR | Status: AC | PRN
  Administered 2020-12-19: 5 mL

## 2020-12-19 MED ORDER — IOHEXOL 300 MG/ML  SOLN
100.0000 mL | Freq: Once | INTRAMUSCULAR | Status: AC | PRN
  Administered 2020-12-19: 100 mL via INTRAVENOUS
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
## 2021-05-17 ENCOUNTER — Ambulatory Visit (INDEPENDENT_AMBULATORY_CARE_PROVIDER_SITE_OTHER): Payer: Medicaid Other | Admitting: Primary Care

## 2021-05-17 ENCOUNTER — Other Ambulatory Visit: Payer: Self-pay

## 2021-05-17 ENCOUNTER — Encounter (INDEPENDENT_AMBULATORY_CARE_PROVIDER_SITE_OTHER): Payer: Self-pay | Admitting: Primary Care

## 2021-05-17 VITALS — BP 131/73 | HR 136 | Temp 97.5°F | Ht 76.0 in

## 2021-05-17 DIAGNOSIS — H6122 Impacted cerumen, left ear: Secondary | ICD-10-CM | POA: Diagnosis not present

## 2021-05-17 DIAGNOSIS — Z0000 Encounter for general adult medical examination without abnormal findings: Secondary | ICD-10-CM

## 2021-05-17 DIAGNOSIS — F1920 Other psychoactive substance dependence, uncomplicated: Secondary | ICD-10-CM | POA: Diagnosis not present

## 2021-05-17 DIAGNOSIS — G8220 Paraplegia, unspecified: Secondary | ICD-10-CM | POA: Diagnosis not present

## 2021-05-17 DIAGNOSIS — F323 Major depressive disorder, single episode, severe with psychotic features: Secondary | ICD-10-CM | POA: Diagnosis not present
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
## 2021-12-04 ENCOUNTER — Ambulatory Visit (INDEPENDENT_AMBULATORY_CARE_PROVIDER_SITE_OTHER): Payer: Medicaid Other | Admitting: Primary Care

## 2021-12-15 ENCOUNTER — Ambulatory Visit (INDEPENDENT_AMBULATORY_CARE_PROVIDER_SITE_OTHER): Payer: Medicaid Other | Admitting: Primary Care

## 2021-12-15 ENCOUNTER — Other Ambulatory Visit: Payer: Self-pay

## 2021-12-15 VITALS — BP 123/82 | HR 103 | Temp 98.0°F | Ht 76.0 in

## 2021-12-15 DIAGNOSIS — G8220 Paraplegia, unspecified: Secondary | ICD-10-CM
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
## 2022-02-23 ENCOUNTER — Telehealth (INDEPENDENT_AMBULATORY_CARE_PROVIDER_SITE_OTHER): Payer: Self-pay | Admitting: Primary Care

## 2022-02-23 NOTE — Telephone Encounter (Signed)
Last OV notes faxed

## 2022-02-23 NOTE — Telephone Encounter (Signed)
Brandon Glass w/ [facility] calling to ask for last OV notes. ?They are managing his catheter supplies and was told they need to get this info from his pcp. ? ?Fax  [phone] ? ?Cb [phone] ?
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
